# Patient Record
Sex: Male | Born: 1940 | Race: Black or African American | Hispanic: No | Marital: Married | Smoking: Former smoker
Health system: Southern US, Community
[De-identification: ages and names within clinical notes are randomized; demographics above are authoritative.]

## PROBLEM LIST (undated history)

## (undated) DIAGNOSIS — S72321A Displaced transverse fracture of shaft of right femur, initial encounter for closed fracture: Secondary | ICD-10-CM

## (undated) DIAGNOSIS — R739 Hyperglycemia, unspecified: Secondary | ICD-10-CM

## (undated) DIAGNOSIS — I10 Essential (primary) hypertension: Secondary | ICD-10-CM

## (undated) DIAGNOSIS — E119 Type 2 diabetes mellitus without complications: Secondary | ICD-10-CM

## (undated) DIAGNOSIS — E785 Hyperlipidemia, unspecified: Secondary | ICD-10-CM

## (undated) DIAGNOSIS — E669 Obesity, unspecified: Secondary | ICD-10-CM

## (undated) HISTORY — DX: Essential (primary) hypertension: I10

## (undated) HISTORY — PX: APPENDECTOMY: SHX54

## (undated) HISTORY — DX: Hyperglycemia, unspecified: R73.9

## (undated) HISTORY — PX: INGUINAL HERNIA REPAIR: SHX194

## (undated) HISTORY — DX: Displaced transverse fracture of shaft of right femur, initial encounter for closed fracture: S72.321A

## (undated) HISTORY — DX: Obesity, unspecified: E66.9

## (undated) HISTORY — DX: Hyperlipidemia, unspecified: E78.5

## (undated) HISTORY — DX: Type 2 diabetes mellitus without complications: E11.9

## (undated) HISTORY — PX: HIP SURGERY: SHX245

---

## 1971-08-14 HISTORY — PX: HEMORRHOID SURGERY: SHX153

## 2000-05-08 ENCOUNTER — Encounter: Payer: Self-pay | Admitting: Surgery

## 2000-05-08 ENCOUNTER — Ambulatory Visit (HOSPITAL_COMMUNITY): Admission: RE | Admit: 2000-05-08 | Discharge: 2000-05-08 | Payer: Self-pay | Admitting: Surgery

## 2000-05-08 ENCOUNTER — Encounter (INDEPENDENT_AMBULATORY_CARE_PROVIDER_SITE_OTHER): Payer: Self-pay | Admitting: Specialist

## 2007-02-26 ENCOUNTER — Ambulatory Visit: Payer: Self-pay | Admitting: Gastroenterology

## 2009-05-16 DIAGNOSIS — E785 Hyperlipidemia, unspecified: Secondary | ICD-10-CM | POA: Insufficient documentation

## 2009-05-16 DIAGNOSIS — E1139 Type 2 diabetes mellitus with other diabetic ophthalmic complication: Secondary | ICD-10-CM

## 2009-05-17 ENCOUNTER — Ambulatory Visit: Payer: Self-pay | Admitting: Gastroenterology

## 2009-05-17 DIAGNOSIS — G4730 Sleep apnea, unspecified: Secondary | ICD-10-CM | POA: Insufficient documentation

## 2009-10-26 ENCOUNTER — Encounter (INDEPENDENT_AMBULATORY_CARE_PROVIDER_SITE_OTHER): Payer: Self-pay | Admitting: *Deleted

## 2010-08-02 ENCOUNTER — Emergency Department (HOSPITAL_COMMUNITY)
Admission: EM | Admit: 2010-08-02 | Discharge: 2010-08-02 | Payer: Self-pay | Source: Home / Self Care | Admitting: Emergency Medicine

## 2010-09-12 NOTE — Letter (Signed)
Summary: Referral - not able to see patient  [facility]  [address] [place], [place] [address]   Phone: [phone]  Fax: [phone]    October 26, 2009         Dr. Elvina Sidle [address] [place], [place]  [address]             Re:   Matvey Bowne DOB:  [birth year] MRN:   [id number]    Dear Dr. Milus Glazier:  Thank you for your kind referral of the above patient.  We have attempted to schedule the recommended procedure, colonoscopy but have not been able to schedule because:  _x_ The patient was not available by phone and/or has not returned our calls.     (Pt has been scheduled for a colonoscopy twice, 2008 and 2010, and pt      canceled both.)  ___ The patient declined to schedule the procedure at this time.  We appreciate the referral and hope that we will have the opportunity to treat this patient in the future.    Sincerely,    [facility] [phone]

## 2010-12-29 NOTE — Op Note (Signed)
[place]  Patient:    Jerry Zimmerman, Jerry Zimmerman                        MRN: [id number] Proc. Date: 05/08/00 Adm. Date:  [id number] Attending:  Andre Lefort CC:         [facility]   Operative Report  PREOPERATIVE DIAGNOSIS:  Right inguinal hernia.  POSTOPERATIVE DIAGNOSIS:  Indirect right inguinal hernia.  OPERATION PERFORMED:  Right inguinal herniorrhaphy with mesh repair.  SURGEON:  Sandria Bales. Ezzard Standing, M.D.  ASSISTANT:  None.  ANESTHESIA:  MAC converted to general anesthesia with approximately 30 cc of local anesthetic.  COMPLICATIONS:  None.  INDICATIONS FOR PROCEDURE:  The patient is a [place] male who comes with a symptomatic right inguinal hernia and comes for repair of this hernia.  DESCRIPTION OF PROCEDURE:  The patient was placed in supine position.  Started off with a MAC anesthesia but this really did not feel very well.  I converted to a general anesthesia. He was given 1 gm of Ancef at the initiation of the procedure.  His right groin had been shaved, prepped with Betadine solution and sterilely draped.  I used local anesthetic and 1% Xylocaine with epinephrine with 0.25% Marcaine with epinephrine mixed 10:1 with sodium bicarbionate.  The skin incision was made down to the external oblique fascia which was opened.  The cord structures were encircled with a Penrose drain. the patient was noted to have a fairly large indirect inguinal hernia on the anterior medial surface of the cord structures.  This sac was elevated from the cord structures, taken down to the internal ring.  The sac was about 8 to 9 cm, had a neck of about 2 cm.  The sac was opened, a finger introduced into the peritoneal cavity.  There was no mass or other defect within the right lower quadrant.  The sac was then twisted and ligated with a 0 chromic suture.  I then carried out the inguinal floor.  Using a piece of Atrium mesh, the mesh was sewn in place  with 0 Novofil suture.  It was sewn medially to the pubic bone, inferiorly to the Coopers ligament, inferolaterally to the shelving edge of the inguinal ligament and superiorly to the transversalis fascia.  A keyhole was cut for the internal ring and the mesh was then sewn behind the keyhole.  The cord structures were returned to their normal location.  The ilioinguinal nerve was identified and spared during the dissection.  The external oblique fascia was then closed with interrupted 3-0 Vicryl suture.  The subcutaneous tissue closed with 3-0 Vicryl suture, the skin closed with running 5-0 subcuticular Vicryl suture, painted with tincture of benzoin, steri-stripped and sterilely dressed.  The patient tolerated the procedure well and was transported to the recovery room in good condition.  I used about 30 cc of local anesthetic during the procedure. He will be discharged home today to return to see me in about two weeks for follow-up. DD:  05/08/00 TD:  05/08/00 Job: [id number]

## 2011-08-26 ENCOUNTER — Encounter: Payer: Self-pay | Admitting: Physician Assistant

## 2011-08-26 DIAGNOSIS — R739 Hyperglycemia, unspecified: Secondary | ICD-10-CM

## 2011-08-26 DIAGNOSIS — I10 Essential (primary) hypertension: Secondary | ICD-10-CM | POA: Insufficient documentation

## 2011-08-26 DIAGNOSIS — E669 Obesity, unspecified: Secondary | ICD-10-CM | POA: Insufficient documentation

## 2011-09-19 ENCOUNTER — Encounter: Payer: Self-pay | Admitting: Gastroenterology

## 2011-10-09 ENCOUNTER — Ambulatory Visit (INDEPENDENT_AMBULATORY_CARE_PROVIDER_SITE_OTHER): Payer: Managed Care, Other (non HMO) | Admitting: Emergency Medicine

## 2011-10-09 ENCOUNTER — Encounter: Payer: Self-pay | Admitting: Physician Assistant

## 2011-10-09 ENCOUNTER — Ambulatory Visit: Payer: Managed Care, Other (non HMO)

## 2011-10-09 DIAGNOSIS — E782 Mixed hyperlipidemia: Secondary | ICD-10-CM

## 2011-10-09 DIAGNOSIS — E119 Type 2 diabetes mellitus without complications: Secondary | ICD-10-CM

## 2011-10-09 DIAGNOSIS — I10 Essential (primary) hypertension: Secondary | ICD-10-CM

## 2011-10-09 DIAGNOSIS — R739 Hyperglycemia, unspecified: Secondary | ICD-10-CM

## 2011-10-09 DIAGNOSIS — Z79899 Other long term (current) drug therapy: Secondary | ICD-10-CM

## 2011-10-09 DIAGNOSIS — E669 Obesity, unspecified: Secondary | ICD-10-CM

## 2011-10-09 DIAGNOSIS — E785 Hyperlipidemia, unspecified: Secondary | ICD-10-CM

## 2011-10-09 DIAGNOSIS — R7309 Other abnormal glucose: Secondary | ICD-10-CM

## 2011-10-09 LAB — TSH: TSH: 0.895 u[IU]/mL (ref 0.350–4.500)

## 2011-10-09 LAB — COMPREHENSIVE METABOLIC PANEL
ALT: 14 U/L (ref 0–53)
AST: 21 U/L (ref 0–37)
Albumin: 4.1 g/dL (ref 3.5–5.2)
Alkaline Phosphatase: 66 U/L (ref 39–117)
BUN: 15 mg/dL (ref 6–23)
CO2: 21 mEq/L (ref 19–32)
Calcium: 9.2 mg/dL (ref 8.4–10.5)
Chloride: 103 mEq/L (ref 96–112)
Creat: 0.88 mg/dL (ref 0.50–1.35)
Glucose, Bld: 118 mg/dL — ABNORMAL HIGH (ref 70–99)
Potassium: 4 mEq/L (ref 3.5–5.3)
Sodium: 136 mEq/L (ref 135–145)
Total Bilirubin: 0.7 mg/dL (ref 0.3–1.2)
Total Protein: 7.5 g/dL (ref 6.0–8.3)

## 2011-10-09 LAB — CBC WITH DIFFERENTIAL/PLATELET
Basophils Absolute: 0 10*3/uL (ref 0.0–0.1)
Basophils Relative: 1 % (ref 0–1)
Eosinophils Absolute: 0.3 10*3/uL (ref 0.0–0.7)
Eosinophils Relative: 5 % (ref 0–5)
HCT: 42 % (ref 39.0–52.0)
Hemoglobin: 14.5 g/dL (ref 13.0–17.0)
Lymphocytes Relative: 36 % (ref 12–46)
Lymphs Abs: 1.8 10*3/uL (ref 0.7–4.0)
MCH: 31.9 pg (ref 26.0–34.0)
MCHC: 34.5 g/dL (ref 30.0–36.0)
MCV: 92.5 fL (ref 78.0–100.0)
Monocytes Absolute: 0.6 10*3/uL (ref 0.1–1.0)
Monocytes Relative: 12 % (ref 3–12)
Neutro Abs: 2.4 10*3/uL (ref 1.7–7.7)
Neutrophils Relative %: 47 % (ref 43–77)
Platelets: 267 10*3/uL (ref 150–400)
RBC: 4.54 MIL/uL (ref 4.22–5.81)
RDW: 12.5 % (ref 11.5–15.5)
WBC: 5 10*3/uL (ref 4.0–10.5)

## 2011-10-09 LAB — POCT CBG (FASTING - GLUCOSE)-MANUAL ENTRY: Glucose Fasting, POC: 112 mg/dL — AB (ref 70–99)

## 2011-10-09 LAB — LIPID PANEL
Cholesterol: 286 mg/dL — ABNORMAL HIGH (ref 0–200)
HDL: 28 mg/dL — ABNORMAL LOW (ref >39)
Total CHOL/HDL Ratio: 10.2 Ratio
Triglycerides: 606 mg/dL — ABNORMAL HIGH (ref <150)

## 2011-10-09 LAB — POCT GLYCOSYLATED HEMOGLOBIN (HGB A1C): Hemoglobin A1C: 6.4

## 2011-10-09 NOTE — Assessment & Plan Note (Signed)
As long as A1C is below 6.5 he can continue working on lifestyle modification.  Above that, plan to restart metformin.  He tolerated it previously, but D/C'd it because he couldn't tell it was doing anything for him.

## 2011-10-09 NOTE — Assessment & Plan Note (Signed)
Restart gemfibrozil.  Discussed the importance of continuing this medication to reduce his risk of cardiovascular event.

## 2011-10-09 NOTE — Assessment & Plan Note (Signed)
Healthy eating, regular exercise.  Discussed the importance of healthy weight in reducing risk of cardiovascular events.

## 2011-10-09 NOTE — Progress Notes (Signed)
Subjective:    Patient ID: Jerry Zimmerman, male    DOB: [birth year], 71 y.o.   MRN: [id number]  HPI Patient presents for follow-up of HTN, hyperlipidemia and hyperglycemia. Stopped all medicine in December.  "Just to see."  "I want to check my kidneys, my heart and my lungs." "I don't feel any different." Long history of medication non-compliance. Former smoker.  Review of Systems Mild myalgias and arthralgias after long days at work. No chest pain, SOB, HA, dizziness, vision change, N/V, diarrhea, dysuria, or rash.     Objective:   Physical Exam  Constitutional: He is oriented to person, place, and time. Vital signs are normal. He appears well-developed and well-nourished. No distress.  HENT:  Head: Normocephalic and atraumatic.  Right Ear: Hearing normal.  Left Ear: Hearing normal.  Eyes: EOM are normal. Pupils are equal, round, and reactive to light.  Neck: Normal range of motion. Neck supple. No thyromegaly present.  Cardiovascular: Normal rate, regular rhythm and normal heart sounds.   Pulses:      Radial pulses are 2+ on the right side, and 2+ on the left side.       Dorsalis pedis pulses are 2+ on the right side, and 2+ on the left side.       Posterior tibial pulses are 2+ on the right side, and 2+ on the left side.  Pulmonary/Chest: Effort normal and breath sounds normal.  Lymphadenopathy:       Head (right side): No tonsillar, no preauricular, no posterior auricular and no occipital adenopathy present.       Head (left side): No tonsillar, no preauricular, no posterior auricular and no occipital adenopathy present.    He has no cervical adenopathy.       Right: No supraclavicular adenopathy present.       Left: No supraclavicular adenopathy present.  Neurological: He is alert and oriented to person, place, and time. No sensory deficit.  Skin: Skin is warm, dry and intact. No rash noted. No cyanosis or erythema. Nails show no clubbing.  Psychiatric: He has a normal mood and  affect.   Results for orders placed in visit on 10/09/11  COMPREHENSIVE METABOLIC PANEL      Component Value Range   Sodium 136  135 - 145 (mEq/L)   Potassium 4.0  3.5 - 5.3 (mEq/L)   Chloride 103  96 - 112 (mEq/L)   CO2 21  19 - 32 (mEq/L)   Glucose, Bld 118 (*) 70 - 99 (mg/dL)   BUN 15  6 - 23 (mg/dL)   Creat 4.09  8.11 - 9.14 (mg/dL)   Total Bilirubin 0.7  0.3 - 1.2 (mg/dL)   Alkaline Phosphatase 66  39 - 117 (U/L)   AST 21  0 - 37 (U/L)   ALT 14  0 - 53 (U/L)   Total Protein 7.5  6.0 - 8.3 (g/dL)   Albumin 4.1  3.5 - 5.2 (g/dL)   Calcium 9.2  8.4 - 78.2 (mg/dL)  LIPID PANEL      Component Value Range   Cholesterol 286 (*) 0 - 200 (mg/dL)   Triglycerides 956 (*) <150 (mg/dL)   HDL 28 (*) >21 (mg/dL)   Total CHOL/HDL Ratio 10.2     VLDL NOT CALC  0 - 40 (mg/dL)   LDL Cholesterol      POCT GLYCOSYLATED HEMOGLOBIN (HGB A1C)      Component Value Range   Hemoglobin A1C 6.4    POCT CBG (FASTING - GLUCOSE)-MANUAL  ENTRY      Component Value Range   Glucose Fasting, POC 112 (*) 70 - 99 (mg/dL)  CBC WITH DIFFERENTIAL      Component Value Range   WBC 5.0  4.0 - 10.5 (K/uL)   RBC 4.54  4.22 - 5.81 (MIL/uL)   Hemoglobin 14.5  13.0 - 17.0 (g/dL)   HCT 47.8  29.5 - 62.1 (%)   MCV 92.5  78.0 - 100.0 (fL)   MCH 31.9  26.0 - 34.0 (pg)   MCHC 34.5  30.0 - 36.0 (g/dL)   RDW 30.8  65.7 - 84.6 (%)   Platelets 267  150 - 400 (K/uL)   Neutrophils Relative 47  43 - 77 (%)   Neutro Abs 2.4  1.7 - 7.7 (K/uL)   Lymphocytes Relative 36  12 - 46 (%)   Lymphs Abs 1.8  0.7 - 4.0 (K/uL)   Monocytes Relative 12  3 - 12 (%)   Monocytes Absolute 0.6  0.1 - 1.0 (K/uL)   Eosinophils Relative 5  0 - 5 (%)   Eosinophils Absolute 0.3  0.0 - 0.7 (K/uL)   Basophils Relative 1  0 - 1 (%)   Basophils Absolute 0.0  0.0 - 0.1 (K/uL)   Smear Review Criteria for review not met    TSH      Component Value Range   TSH 0.895  0.350 - 4.500 (uIU/mL)      CXR: [facility] reading (PRIMARY) by  Dr. Cleta Alberts.  Tortuous aorta.  No infiltrates.      Assessment & Plan:

## 2011-10-09 NOTE — Patient Instructions (Addendum)
You need to restart your medicines.

## 2011-10-09 NOTE — Assessment & Plan Note (Signed)
Restart lisinopril.  Discussed the importance of BP control in reducing his risk of cardiovascular event.

## 2011-10-10 ENCOUNTER — Encounter: Payer: Self-pay | Admitting: Physician Assistant

## 2011-10-12 ENCOUNTER — Encounter: Payer: Self-pay | Admitting: Physician Assistant

## 2011-11-08 ENCOUNTER — Ambulatory Visit (INDEPENDENT_AMBULATORY_CARE_PROVIDER_SITE_OTHER): Payer: Managed Care, Other (non HMO) | Admitting: Physician Assistant

## 2011-11-08 ENCOUNTER — Encounter: Payer: Self-pay | Admitting: Physician Assistant

## 2011-11-08 VITALS — BP 126/76 | HR 89 | Temp 98.0°F | Resp 16 | Ht 66.0 in | Wt 187.0 lb

## 2011-11-08 DIAGNOSIS — I10 Essential (primary) hypertension: Secondary | ICD-10-CM

## 2011-11-08 DIAGNOSIS — E785 Hyperlipidemia, unspecified: Secondary | ICD-10-CM

## 2011-11-08 DIAGNOSIS — E119 Type 2 diabetes mellitus without complications: Secondary | ICD-10-CM

## 2011-11-08 DIAGNOSIS — E782 Mixed hyperlipidemia: Secondary | ICD-10-CM

## 2011-11-08 DIAGNOSIS — Z79899 Other long term (current) drug therapy: Secondary | ICD-10-CM

## 2011-11-08 LAB — BASIC METABOLIC PANEL
BUN: 14 mg/dL (ref 6–23)
Potassium: 4 mEq/L (ref 3.5–5.3)
Sodium: 135 mEq/L (ref 135–145)

## 2011-11-08 NOTE — Patient Instructions (Signed)
Continue taking the lisinopril for your blood pressure (it's WORKING!!!). Continue taking the gemfibrozil for your cholesterol. Add an aspirin daily (81 mg) to help prevent a heart attack and stroke. When you return in 2 months, do not eat or drink anything for 8-12 hours, so we can check your cholesterol (you may take your medication and drink water, black coffee, or unsweetened tea).

## 2011-11-08 NOTE — Assessment & Plan Note (Signed)
Continue current treatment, pending lab results. Update lipids in 2 months.

## 2011-11-08 NOTE — Assessment & Plan Note (Addendum)
Well controlled. Continue current treatment, pending lab results. Restart ASA 81 mg QD

## 2011-11-08 NOTE — Progress Notes (Signed)
  Subjective:    Patient ID: Jerry Zimmerman, male    DOB: April 24, 1941, 71 y.o.   MRN: [id number]  HPI  Presents for follow-up of HTN after restarting lisinopril 5 mg.  Has also restarted gemfibrozil for mixed hyperlipidemia, with significant hypertriglyceridemia.  He reports he feels no different than before.  He has stopped the daily aspirin, not sure why.  Review of Systems No chest pain, SOB, HA, dizziness, vision change, N/V, diarrhea, dysuria, myalgias, arthralgias or rash.     Objective:   Physical Exam Vital signs noted. Well-developed, well nourished [name] who is awake, alert and oriented, in NAD. HEENT: [place]/AT, sclera and conjunctiva are clear.   Neck: supple, non-tender, no lymphadenopathy, thyromegaly. Heart: RRR, no murmur Lungs: CTA Extremities: no cyanosis, clubbing or edema. Skin: warm and dry without rash.  BMET is pending.      Assessment & Plan:   1. Unspecified essential hypertension  Basic metabolic panel  2. Other and unspecified hyperlipidemia    3. Type II or unspecified type diabetes mellitus without mention of complication, not stated as uncontrolled    4. Encounter for long-term (current) use of other medications  Basic metabolic panel

## 2011-11-09 ENCOUNTER — Encounter: Payer: Self-pay | Admitting: Physician Assistant

## 2012-01-01 ENCOUNTER — Ambulatory Visit (INDEPENDENT_AMBULATORY_CARE_PROVIDER_SITE_OTHER): Payer: BC Managed Care – PPO | Admitting: Physician Assistant

## 2012-01-01 ENCOUNTER — Encounter: Payer: Self-pay | Admitting: Physician Assistant

## 2012-01-01 VITALS — BP 131/84 | HR 89 | Temp 98.1°F | Resp 16 | Ht 66.5 in | Wt 190.2 lb

## 2012-01-01 DIAGNOSIS — E785 Hyperlipidemia, unspecified: Secondary | ICD-10-CM

## 2012-01-01 DIAGNOSIS — E119 Type 2 diabetes mellitus without complications: Secondary | ICD-10-CM

## 2012-01-01 DIAGNOSIS — E781 Pure hyperglyceridemia: Secondary | ICD-10-CM

## 2012-01-01 DIAGNOSIS — I10 Essential (primary) hypertension: Secondary | ICD-10-CM

## 2012-01-01 LAB — COMPREHENSIVE METABOLIC PANEL
ALT: 12 U/L (ref 0–53)
AST: 16 U/L (ref 0–37)
Calcium: 8.9 mg/dL (ref 8.4–10.5)
Chloride: 101 mEq/L (ref 96–112)
Creat: 0.86 mg/dL (ref 0.50–1.35)
Potassium: 4 mEq/L (ref 3.5–5.3)

## 2012-01-01 LAB — POCT GLYCOSYLATED HEMOGLOBIN (HGB A1C): Hemoglobin A1C: 6.9

## 2012-01-01 LAB — LIPID PANEL: Total CHOL/HDL Ratio: 12.8 Ratio

## 2012-01-01 LAB — GLUCOSE, POCT (MANUAL RESULT ENTRY): POC Glucose: 161 mg/dl — AB (ref 70–99)

## 2012-01-01 NOTE — Patient Instructions (Addendum)
Continue your medications.  If your cholesterol is still too high, I'll ask that you increase the gemfibrozil (Lopid) to twice daily.  Continue working on healthy eating and regular exercise to help protect your heart, keep your cholesterol down, and prevent your blood sugar from getting too high.

## 2012-01-01 NOTE — Progress Notes (Signed)
  Subjective:    Patient ID: Jerry Zimmerman, male    DOB: March 30, 1941, 71 y.o.   MRN: [id number]  HPI Presents for follow-up of HTN, hyperlipidemia, and DM type 2.  He feels well. Joint stiffness when gets up from sleep. Resolves with movement and stretching, and he doesn't want to take any medication for it.  Only takes [place] once daily. States he can't tell any difference on the medications and again wants to D/C them.  Review of Systems No chest pain, SOB, HA, dizziness, vision change, N/V, diarrhea, dysuria, myalgias or rash.  Joint stiff ness as above.     Objective:   Physical Exam  Vital signs noted. Well-developed, well nourished [place] who is awake, alert and oriented, in NAD. HEENT: [place]/AT, PERRL, EOMI.  Sclera and conjunctiva are clear.  Neck: supple, non-tender, no lymphadenopathy, thyromegaly. Heart: RRR, no murmur Lungs: CTA Abdomen: normo-active bowel sounds, supple, non-tender, no mass or organomegaly. Extremities: no cyanosis, clubbing or edema. Skin: warm and dry without rash.  Results for orders placed in visit on 01/01/12  GLUCOSE, POCT (MANUAL RESULT ENTRY)      Component Value Range   POC Glucose 161 (*) 70 - 99 (mg/dl)  POCT GLYCOSYLATED HEMOGLOBIN (HGB A1C)      Component Value Range   Hemoglobin A1C 6.9          Assessment & Plan:

## 2012-01-01 NOTE — Assessment & Plan Note (Signed)
A1C is 6.9, and our plan had been to restart metformin if it was above 6.5.  However, he's still struggling taking his other medications regularly (because he doesn't really think he needs them), so I've elected to hold off for now. He's encouraged to continue working on lifestyle changes.

## 2012-01-01 NOTE — Assessment & Plan Note (Signed)
Non-compliance continues to be a problem here.  He's only taking gemfibrozil once daily and is reluctant to increase it to BID. We agree to wait to see what his labs show today before increasing the dose to BID.

## 2012-01-01 NOTE — Assessment & Plan Note (Signed)
Continue current treatment.  Reminded that even though his pressure is improved, he needs to stay on the lisinopril to keep it down.

## 2012-01-03 ENCOUNTER — Encounter: Payer: Self-pay | Admitting: Physician Assistant

## 2012-03-09 ENCOUNTER — Other Ambulatory Visit: Payer: Self-pay | Admitting: Physician Assistant

## 2012-03-12 ENCOUNTER — Other Ambulatory Visit: Payer: Self-pay

## 2012-03-27 ENCOUNTER — Ambulatory Visit: Payer: BC Managed Care – PPO | Admitting: Physician Assistant

## 2012-04-08 ENCOUNTER — Ambulatory Visit (INDEPENDENT_AMBULATORY_CARE_PROVIDER_SITE_OTHER): Payer: BC Managed Care – PPO | Admitting: Physician Assistant

## 2012-04-08 ENCOUNTER — Encounter: Payer: Self-pay | Admitting: Physician Assistant

## 2012-04-08 VITALS — BP 134/78 | HR 81 | Temp 98.1°F | Resp 16 | Ht 66.0 in | Wt 187.0 lb

## 2012-04-08 DIAGNOSIS — E119 Type 2 diabetes mellitus without complications: Secondary | ICD-10-CM

## 2012-04-08 DIAGNOSIS — E785 Hyperlipidemia, unspecified: Secondary | ICD-10-CM

## 2012-04-08 DIAGNOSIS — I10 Essential (primary) hypertension: Secondary | ICD-10-CM

## 2012-04-08 DIAGNOSIS — E782 Mixed hyperlipidemia: Secondary | ICD-10-CM

## 2012-04-08 DIAGNOSIS — Z23 Encounter for immunization: Secondary | ICD-10-CM

## 2012-04-08 LAB — COMPREHENSIVE METABOLIC PANEL
AST: 21 U/L (ref 0–37)
Albumin: 3.8 g/dL (ref 3.5–5.2)
BUN: 14 mg/dL (ref 6–23)
Calcium: 8.9 mg/dL (ref 8.4–10.5)
Chloride: 105 mEq/L (ref 96–112)
Glucose, Bld: 119 mg/dL — ABNORMAL HIGH (ref 70–99)
Potassium: 3.8 mEq/L (ref 3.5–5.3)

## 2012-04-08 LAB — LIPID PANEL: HDL: 28 mg/dL — ABNORMAL LOW (ref >39)

## 2012-04-08 MED ORDER — DOXYCYCLINE HYCLATE 100 MG PO TABS: 100.0000 mg | ORAL_TABLET | Freq: Every day | ORAL | Status: AC

## 2012-04-08 MED ORDER — GEMFIBROZIL 600 MG PO TABS
600.0000 mg | ORAL_TABLET | Freq: Two times a day (BID) | ORAL | Status: DC
Start: 2012-04-08 — End: 2013-01-01

## 2012-04-08 MED ORDER — LISINOPRIL 5 MG PO TABS: 5.0000 mg | ORAL_TABLET | Freq: Every day | ORAL | Status: DC

## 2012-04-08 NOTE — Assessment & Plan Note (Signed)
Encouraged him to take gemfibrozil BID, even if he breaks them in half.

## 2012-04-08 NOTE — Assessment & Plan Note (Signed)
Controlled.  Continue current treatment, and DON"T RUN OUT!

## 2012-04-08 NOTE — Progress Notes (Signed)
  Subjective:    Patient ID: Jerry Zimmerman, male    DOB: Jan 22, 1941, 71 y.o.   MRN: [id number]  HPI This 71 y.o. male presents for evaluation of HTN, hyperlipidemia. Has only been taking the gemfibrozil QD, "because the pills are too big." Will be leaving for [place] next month, will be gone for 2 months.  Will need malaria prophylaxis and larger supply of his regular medications. Review of Systems  Denies chest pain, shortness of breath, HA, dizziness, vision change, nausea, vomiting, diarrhea, constipation, melena, hematochezia, dysuria, increased urinary urgency or frequency, increased hunger or thirst, unintentional weight change, unexplained myalgias or arthralgias, rash.   Past Medical History  Diagnosis Date  . Hyperlipidemia   . Hyperglycemia   . Essential hypertension, benign   . Obesity     Past Surgical History  Procedure Date  . Inguinal hernia repair     right  . Hernia repair     Prior to Admission medications   Medication Sig Start Date End Date Taking? Authorizing Provider  aspirin 81 MG tablet Take 81 mg by mouth daily.   Yes Historical Provider, MD  gemfibrozil (LOPID) 600 MG tablet Take 600 mg by mouth 2 (two) times daily before a meal.   Yes Historical Provider, MD  lisinopril (PRINIVIL,ZESTRIL) 5 MG tablet TAKE ONE TABLET BY MOUTH EVERY DAY 03/09/12  Yes Morrell Riddle, PA-C    No Known Allergies  History   Social History  . Marital Status: Married    Spouse Name: N/A    Number of Children: 4  . Years of Education: 14   Occupational History  . Quality Programmer, multimedia (pill maufacturing)   Social History Main Topics  . Smoking status: Former Smoker -- 3.0 packs/day for 18 years    Types: Cigarettes    Quit date: 08/13/1987  . Smokeless tobacco: Not on file  . Alcohol Use: No  . Drug Use: No  . Sexually Active: Yes -- Male partner(s)   Other Topics Concern  . 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    History reviewed. No pertinent family history.     Objective:   Physical Exam  Blood pressure 134/78, pulse 81, temperature 98.1 F (36.7 C), temperature source Oral, resp. rate 16, height 5\' 6"  (1.676 m), weight 187 lb (84.823 kg), SpO2 98.00%. Body mass index is 30.18 kg/(m^2). Well-developed, well nourished [name] who is awake, alert and oriented, in NAD. HEENT: [place]/AT, PERRL, EOMI.  Sclera and conjunctiva are clear.  EAC are patent, TMs are normal in appearance. Nasal mucosa is pink and moist. OP is clear. Neck: supple, non-tender, no lymphadenopathy, thyromegaly. Heart: RRR, no murmur Lungs: CTA Abdomen: normo-active bowel sounds, supple, non-tender, no mass or organomegaly. Extremities: no cyanosis, clubbing or edema. Skin: warm and dry without rash.     Assessment & Plan:

## 2012-04-08 NOTE — Assessment & Plan Note (Signed)
Continue lifestyle modification.  Still trouble with compliance.  Recheck 07/2012.

## 2012-04-09 ENCOUNTER — Encounter: Payer: Self-pay | Admitting: Physician Assistant

## 2012-07-31 ENCOUNTER — Ambulatory Visit (INDEPENDENT_AMBULATORY_CARE_PROVIDER_SITE_OTHER): Payer: BC Managed Care – PPO | Admitting: Physician Assistant

## 2012-07-31 ENCOUNTER — Encounter: Payer: Self-pay | Admitting: Physician Assistant

## 2012-07-31 VITALS — BP 146/70 | HR 92 | Temp 98.0°F | Resp 16 | Ht 65.75 in | Wt 188.4 lb

## 2012-07-31 DIAGNOSIS — E785 Hyperlipidemia, unspecified: Secondary | ICD-10-CM

## 2012-07-31 DIAGNOSIS — K921 Melena: Secondary | ICD-10-CM

## 2012-07-31 DIAGNOSIS — I10 Essential (primary) hypertension: Secondary | ICD-10-CM

## 2012-07-31 DIAGNOSIS — E119 Type 2 diabetes mellitus without complications: Secondary | ICD-10-CM

## 2012-07-31 LAB — COMPREHENSIVE METABOLIC PANEL
ALT: 15 U/L (ref 0–53)
AST: 20 U/L (ref 0–37)
Albumin: 3.8 g/dL (ref 3.5–5.2)
BUN: 11 mg/dL (ref 6–23)
Calcium: 9.2 mg/dL (ref 8.4–10.5)
Chloride: 103 mEq/L (ref 96–112)
Potassium: 4 mEq/L (ref 3.5–5.3)
Sodium: 136 mEq/L (ref 135–145)
Total Protein: 7.5 g/dL (ref 6.0–8.3)

## 2012-07-31 LAB — GLUCOSE, POCT (MANUAL RESULT ENTRY): POC Glucose: 123 mg/dl — AB (ref 70–99)

## 2012-07-31 LAB — TSH: TSH: 0.58 u[IU]/mL (ref 0.350–4.500)

## 2012-07-31 MED ORDER — METFORMIN HCL ER (MOD) 500 MG PO TB24: 500.0000 mg | ORAL_TABLET | Freq: Every day | ORAL | Status: DC

## 2012-07-31 NOTE — Patient Instructions (Addendum)
Make sure you drink plenty of fluids and eat foods containing potassium to help prevent cramping in your legs during prayers and at night. Your blood sugar is elevated, so I would like you to restart METFORMIN to lower it.  Take it ONE TIME EVERY DAY, along with your other medications as well.

## 2012-07-31 NOTE — Progress Notes (Signed)
Subjective:    Patient ID: Jerry Zimmerman, male    DOB: Jul 31, 1941, 71 y.o.   MRN: [id number]  HPI This 71 y.o. male presents for evaluation of HTN, elevated glucose and hyperlipidemia.  He says he feels great, but again he's not taking lisinopril regularly (only sometimes).  He states that he doesn't feel any different whether he takes it or not.  He is taking the gemfibrozil BID, however, at least presently.  He reports some bright red blood on the toilet tissue after BM intermittently.  He believes it is related to surgery he had in the 1970's for "piles," and denies pain with BM, melena. He's going to visit [place] in February, and wants to get this evaluated before he leaves.  He has not had a colonoscopy in the past 10 years.  Past Medical History  Diagnosis Date  . Hyperlipidemia   . Hyperglycemia   . Essential hypertension, benign   . Obesity     Past Surgical History  Procedure Date  . Inguinal hernia repair     right  . Hernia repair   . Hemorrhoid surgery 1973    Prior to Admission medications   Medication Sig Start Date End Date Taking? Authorizing Provider  aspirin 81 MG tablet Take 81 mg by mouth daily.   Yes Historical Provider, MD  gemfibrozil (LOPID) 600 MG tablet Take 1 tablet (600 mg total) by mouth 2 (two) times daily before a meal. 04/08/12  Yes Shekera Beavers S Lucilia Yanni, PA-C  lisinopril (PRINIVIL,ZESTRIL) 5 MG tablet Take 1 tablet (5 mg total) by mouth daily. 04/08/12   Fernande Bras, PA-C    Not on File  History   Social History  . Marital Status: Married    Spouse Name: N/A    Number of Children: 4  . Years of Education: 14   Occupational History  . Quality Programmer, multimedia (pill maufacturing)   Social History Main Topics  . Smoking status: Former Smoker -- 3.0 packs/day for 18 years    Types: Cigarettes    Quit date: 08/13/1987  . Smokeless tobacco: Not on file  . Alcohol Use: No  . Drug Use: No  . Sexually Active: Yes -- Male partner(s)    Other Topics Concern  . 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    History reviewed. No pertinent family history.  Review of Systems As above. Denies chest pain, shortness of breath, HA, dizziness, vision change, nausea, vomiting, diarrhea, constipation, dysuria, increased urinary urgency or frequency, increased hunger or thirst, unintentional weight change, unexplained myalgias or arthralgias, rash.     Objective:   Physical Exam Blood pressure 146/70, pulse 92, temperature 98 F (36.7 C), temperature source Oral, resp. rate 16, height 5' 5.75" (1.67 m), weight 188 lb 6.4 oz (85.458 kg), SpO2 99.00%. Body mass index is 30.64 kg/(m^2). Well-developed, well nourished [name] who is awake, alert and oriented, in NAD. HEENT: [place]/AT, sclera and conjunctiva are clear.   Neck: supple, non-tender, no lymphadenopathy, thyromegaly. Heart: RRR, no murmur Lungs: normal effort, CTA Extremities: no cyanosis, clubbing or edema. Skin: warm and dry without rash. Psychologic: good mood and appropriate affect, normal speech and behavior.   Results for orders placed in visit on 07/31/12  GLUCOSE, POCT (MANUAL RESULT ENTRY)  Component Value Range   POC Glucose 123 (*) 70 - 99 mg/dl  POCT GLYCOSYLATED HEMOGLOBIN (HGB A1C)      Component Value Range   Hemoglobin A1C 7.3         Assessment & Plan:   1. Essential hypertension, benign -uncontrolled, likely due to medication non-compliance TSH; stressed the importance of taking his prescribed medications every day, even if he doesn't feel any difference.  2. DIABETES MELLITUS  POCT glucose (manual entry), POCT glycosylated hemoglobin (Hb A1C), RESTART metFORMIN (GLUMETZA) 500 MG (MOD) 24 hr tablet  3. HYPERLIPIDEMIA   Comprehensive metabolic panel, Lipid panel; continue gemfibrozil BID  4. Hematochezia  Ambulatory referral to Gastroenterology, needs colonoscopy   He will [place] in February before his trip.  We will again discuss outstanding health maintenance items, including DRE, PSA, shingles vaccination, etc.

## 2012-08-01 ENCOUNTER — Encounter: Payer: Self-pay | Admitting: Physician Assistant

## 2012-08-04 ENCOUNTER — Encounter: Payer: Self-pay | Admitting: Gastroenterology

## 2012-08-21 ENCOUNTER — Encounter: Payer: Self-pay | Admitting: *Deleted

## 2012-08-28 ENCOUNTER — Ambulatory Visit: Payer: Self-pay | Admitting: Gastroenterology

## 2012-08-28 ENCOUNTER — Telehealth: Payer: Self-pay | Admitting: Gastroenterology

## 2012-08-28 ENCOUNTER — Telehealth: Payer: Self-pay | Admitting: Physician Assistant

## 2012-08-28 NOTE — Telephone Encounter (Signed)
Dismissal Letter sent by Certified Mail 08/28/2012  Received the Return Receipt showing someone picked up the Dismissal Letter 09/01/2012

## 2012-08-28 NOTE — Telephone Encounter (Signed)
Patient said he didn't know he had an appt. But said he would call there office today and reschedule.

## 2012-08-28 NOTE — Telephone Encounter (Signed)
Please call the patient.  I received a call that he did not show up for his appointment with Dr. Jarold Motto, GI specialist.  What happened?

## 2012-08-28 NOTE — Telephone Encounter (Signed)
Pt called in wanting to make an appointment. I explained to him that he has been discharged from the practice and will need to continue his GI care with another GI Dr.  Also told him he will be getting a letter in the mail.  Pt said thank you and hung up

## 2012-09-23 LAB — HM COLONOSCOPY: HM Colonoscopy: NORMAL

## 2012-09-25 ENCOUNTER — Ambulatory Visit: Payer: BC Managed Care – PPO | Admitting: Physician Assistant

## 2012-10-09 ENCOUNTER — Encounter: Payer: Self-pay | Admitting: Physician Assistant

## 2012-10-09 ENCOUNTER — Ambulatory Visit (INDEPENDENT_AMBULATORY_CARE_PROVIDER_SITE_OTHER): Payer: BC Managed Care – PPO | Admitting: Physician Assistant

## 2012-10-09 VITALS — BP 133/76 | HR 56 | Temp 97.7°F | Resp 16 | Ht 66.0 in | Wt 190.0 lb

## 2012-10-09 DIAGNOSIS — E119 Type 2 diabetes mellitus without complications: Secondary | ICD-10-CM

## 2012-10-09 LAB — LIPID PANEL
Cholesterol: 255 mg/dL — ABNORMAL HIGH (ref 0–200)
LDL Cholesterol: 157 mg/dL — ABNORMAL HIGH (ref 0–99)
Total CHOL/HDL Ratio: 8.2 Ratio
Triglycerides: 337 mg/dL — ABNORMAL HIGH (ref <150)
VLDL: 67 mg/dL — ABNORMAL HIGH (ref 0–40)

## 2012-10-09 LAB — COMPREHENSIVE METABOLIC PANEL
ALT: 17 U/L (ref 0–53)
Alkaline Phosphatase: 58 U/L (ref 39–117)
CO2: 23 mEq/L (ref 19–32)
Creat: 0.89 mg/dL (ref 0.50–1.35)
Sodium: 136 mEq/L (ref 135–145)
Total Bilirubin: 0.7 mg/dL (ref 0.3–1.2)
Total Protein: 7.1 g/dL (ref 6.0–8.3)

## 2012-10-09 LAB — POCT GLYCOSYLATED HEMOGLOBIN (HGB A1C): Hemoglobin A1C: 6.9

## 2012-10-09 NOTE — Progress Notes (Signed)
Subjective:    Patient ID: Jerry Zimmerman, male    DOB: Jun 12, 1941, 72 y.o.   MRN: [id number]  HPI This 72 y.o. male presents for evaluation of HTN, hyperlipidemia, DM type 2. He reports that he feels great, except for general mild aches and pains associated with heavy work. Unfortunately, he has not been taking the metformin prescribed at his last visit, and thinks he's taking his other medications.  He has a long history of medication noncompliance.   Past Medical History  Diagnosis Date  . Hyperlipidemia   . Hyperglycemia   . Essential hypertension, benign   . Obesity     Past Surgical History  Procedure Laterality Date  . Inguinal hernia repair      right  . Appendectomy    . Hemorrhoid surgery  1973    Prior to Admission medications   Medication Sig Start Date End Date Taking? Authorizing Provider  aspirin 81 MG tablet Take 81 mg by mouth daily.   Yes Historical Provider, MD  gemfibrozil (LOPID) 600 MG tablet Take 1 tablet (600 mg total) by mouth 2 (two) times daily before a meal. 04/08/12  Yes Lily Kernen S Takisha Pelle, PA-C  lisinopril (PRINIVIL,ZESTRIL) 5 MG tablet Take 1 tablet (5 mg total) by mouth daily. 04/08/12  Yes Jolleen Seman S Chevonne Bostrom, PA-C  metFORMIN (GLUMETZA) 500 MG (MOD) 24 hr tablet Take 1 tablet (500 mg total) by mouth daily with breakfast. 07/31/12   Fernande Bras, PA-C    No Known Allergies  History   Social History  . Marital Status: Married    Spouse Name: N/A    Number of Children: 4  . Years of Education: 14   Occupational History  . Quality Programmer, multimedia (pill maufacturing)   Social History Main Topics  . Smoking status: Former Smoker -- 3.00 packs/day for 18 years    Types: Cigarettes    Quit date: 08/13/1987  . Smokeless tobacco: Never Used  . Alcohol Use: No  . Drug Use: No  . Sexually Active: Yes -- Male partner(s)   Other Topics Concern  . Not on file   Social History Narrative   Originally from [place], one of 12 siblings.   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             Family History  Problem Relation Age of Onset  . Colon cancer Neg Hx      Review of Systems No chest pain, SOB, HA, dizziness, vision change, N/V, diarrhea, constipation, dysuria, urinary urgency or frequency, myalgias, arthralgias or rash.     Objective:   Physical Exam Blood pressure 133/76, pulse 56, temperature 97.7 F (36.5 C), resp. rate 16, height 5\' 6"  (1.676 m), weight 190 lb (86.183 kg). Body mass index is 30.68 kg/(m^2). Well-developed, well nourished [name] who is awake, alert and oriented, in NAD. HEENT: [place]/AT, PERRL, EOMI.  Sclera and conjunctiva are clear.  EAC are patent, TMs are normal in appearance. Nasal mucosa is pink and moist. OP is clear. Neck: supple, non-tender, no lymphadenopathy, thyromegaly. Heart: RRR, no murmur Lungs: normal effort, CTA Abdomen: normo-active bowel sounds, supple, non-tender, no mass or organomegaly. Extremities: no cyanosis, clubbing or edema. Skin: warm and dry without rash. Psychologic: good mood and appropriate affect, normal speech and behavior.  See DM foot exam.  Results for orders placed in visit on 10/09/12  GLUCOSE, POCT (MANUAL RESULT  ENTRY)      Result Value Range   POC Glucose 109 (*) 70 - 99 mg/dl  POCT GLYCOSYLATED HEMOGLOBIN (HGB A1C)      Result Value Range   Hemoglobin A1C 6.9         Assessment & Plan:  Type II or unspecified type diabetes mellitus without mention of complication, not stated as uncontrolled - Plan: POCT glucose (manual entry), POCT glycosylated hemoglobin (Hb A1C), Comprehensive metabolic panel, Microalbumin, urine  HTN (hypertension)  Mixed hyperlipidemia - Plan: Lipid panel  Screening for colon cancer - Plan: PSA  Reminded him to take all his meds EVERY SINGLE DAY. RTC 3 months.

## 2012-10-09 NOTE — Patient Instructions (Signed)
Please take your medications as prescribed.  Check with the pharmacy about the metformin.  Please take it just ONCE each day (the instructions will say to take it twice each day, but since your blood sugar is improved, you only need to take it once each day).

## 2012-10-10 ENCOUNTER — Encounter: Payer: Self-pay | Admitting: Physician Assistant

## 2012-11-06 ENCOUNTER — Telehealth: Payer: Self-pay

## 2012-11-06 DIAGNOSIS — I10 Essential (primary) hypertension: Secondary | ICD-10-CM

## 2012-11-06 DIAGNOSIS — E1165 Type 2 diabetes mellitus with hyperglycemia: Secondary | ICD-10-CM

## 2012-11-06 MED ORDER — LISINOPRIL 5 MG PO TABS: 5.0000 mg | ORAL_TABLET | Freq: Every day | ORAL | Status: DC

## 2012-11-06 MED ORDER — METFORMIN HCL 500 MG PO TABS: 500.0000 mg | ORAL_TABLET | Freq: Every day | ORAL | Status: DC

## 2012-11-06 NOTE — Telephone Encounter (Signed)
ALSO NEEDS REFILL ON BLOOD PRESSURE MED

## 2012-11-06 NOTE — Telephone Encounter (Signed)
Thanks, I have advised patient of this.

## 2012-11-06 NOTE — Telephone Encounter (Signed)
Pended lisinopril please advise. I think Glumetza should have been Meftormin. Jerry Zimmerman

## 2012-11-06 NOTE — Telephone Encounter (Signed)
THE MED THAT [name] PRESCRIBED FOR DIABETES IS TOO EXPENSIVE. CAN SHE PRESCRIBE SOMETHING FROM THE [facility] LIST FOR 90 DAY SUPPLY?  THANKS  [facility] [address]

## 2012-11-06 NOTE — Telephone Encounter (Signed)
I have sent in meds.  I sent regular Metformin to the pharmacy for the patient.  He may have more GI complaints with the immediate release than with the extended release.

## 2013-01-01 ENCOUNTER — Encounter: Payer: Self-pay | Admitting: Physician Assistant

## 2013-01-01 ENCOUNTER — Ambulatory Visit (INDEPENDENT_AMBULATORY_CARE_PROVIDER_SITE_OTHER): Payer: BC Managed Care – PPO | Admitting: Physician Assistant

## 2013-01-01 VITALS — BP 120/76 | HR 80 | Temp 99.0°F | Resp 16 | Ht 66.5 in | Wt 184.0 lb

## 2013-01-01 DIAGNOSIS — E785 Hyperlipidemia, unspecified: Secondary | ICD-10-CM

## 2013-01-01 DIAGNOSIS — E119 Type 2 diabetes mellitus without complications: Secondary | ICD-10-CM

## 2013-01-01 DIAGNOSIS — I10 Essential (primary) hypertension: Secondary | ICD-10-CM

## 2013-01-01 DIAGNOSIS — E782 Mixed hyperlipidemia: Secondary | ICD-10-CM

## 2013-01-01 LAB — LIPID PANEL
Cholesterol: 187 mg/dL (ref 0–200)
HDL: 33 mg/dL — ABNORMAL LOW (ref >39)
Total CHOL/HDL Ratio: 5.7 Ratio
Triglycerides: 185 mg/dL — ABNORMAL HIGH (ref <150)
VLDL: 37 mg/dL (ref 0–40)

## 2013-01-01 LAB — COMPREHENSIVE METABOLIC PANEL
AST: 18 U/L (ref 0–37)
BUN: 13 mg/dL (ref 6–23)
Calcium: 8.9 mg/dL (ref 8.4–10.5)
Chloride: 104 mEq/L (ref 96–112)
Creat: 0.84 mg/dL (ref 0.50–1.35)
Total Bilirubin: 0.8 mg/dL (ref 0.3–1.2)

## 2013-01-01 MED ORDER — GEMFIBROZIL 600 MG PO TABS: 600.0000 mg | ORAL_TABLET | Freq: Two times a day (BID) | ORAL | Status: DC

## 2013-01-01 MED ORDER — LISINOPRIL 5 MG PO TABS: 5.0000 mg | ORAL_TABLET | Freq: Every day | ORAL | Status: DC

## 2013-01-01 MED ORDER — METFORMIN HCL 500 MG PO TABS: 500.0000 mg | ORAL_TABLET | Freq: Every day | ORAL | Status: DC

## 2013-01-01 NOTE — Patient Instructions (Signed)
Keep taking your medications-all of them.  Follow the instructions on the bottles. Please also take an aspirin every day, 81 mg.

## 2013-01-01 NOTE — Progress Notes (Signed)
  Subjective:    Patient ID: Jerry Zimmerman, male    DOB: 09/09/1940, 72 y.o.   MRN: [id number]  HPI This 72 y.o. male presents for evaluation of DM type 2, HTN, Hyperlipidemia.  Patient Active Problem List   Diagnosis Date Noted  . Essential hypertension, benign   . Obesity   . SLEEP APNEA 05/17/2009  . DIABETES MELLITUS 05/16/2009  . HYPERLIPIDEMIA 05/16/2009   Past medical history, surgical history, family history, social history and problem list reviewed.  Doesn't check glucose at home. Checks feet daily.  Current of flu and pneumococcal vaccine.  Review of Systems Some looser stools with regular use of metformin.  Tolerating gemfibrozil. Denies chest pain, shortness of breath, HA, dizziness, vision change, nausea, vomiting, melena, hematochezia, dysuria, increased urinary urgency or frequency, increased hunger or thirst, unintentional weight change, unexplained myalgias or arthralgias, rash.     Objective:   Physical Exam Blood pressure 120/76, pulse 80, temperature 99 F (37.2 C), temperature source Oral, resp. rate 16, height 5' 6.5" (1.689 m), weight 184 lb (83.462 kg), SpO2 99.00%. Body mass index is 29.26 kg/(m^2). Well-developed, well nourished [name] who is awake, alert and oriented, in NAD. HEENT: [place]/AT, PERRL, EOMI.  Sclera and conjunctiva are clear.  EAC are patent, TMs are normal in appearance. Nasal mucosa is pink and moist. OP is clear. Neck: supple, non-tender, no lymphadenopathy, thyromegaly. Heart: RRR, no murmur Lungs: normal effort, CTA Abdomen: normo-active bowel sounds, supple, non-tender, no mass or organomegaly. Extremities: no cyanosis, clubbing or edema. Skin: warm and dry without rash. See DM foot exam. Psychologic: good mood and appropriate affect, normal speech and behavior.  Results for orders placed in visit on 01/01/13  GLUCOSE, POCT (MANUAL RESULT ENTRY)      Result Value Range   POC Glucose 106 (*) 70 - 99 mg/dl  POCT GLYCOSYLATED  HEMOGLOBIN (HGB A1C)      Result Value Range   Hemoglobin A1C 6.3        Assessment & Plan:  DIABETES MELLITUS - Plan: POCT glucose (manual entry), POCT glycosylated hemoglobin (Hb A1C), Comprehensive metabolic panel, metFORMIN (GLUCOPHAGE) 500 MG tablet  Essential hypertension, benign - Plan: lisinopril (PRINIVIL,ZESTRIL) 5 MG tablet  HYPERLIPIDEMIA - Plan: Lipid panel, gemfibrozil (LOPID) 600 MG tablet  RTC 3 months.  Fernande Bras, PA-C Physician Assistant-Certified [facility]

## 2013-03-12 LAB — HM DIABETES EYE EXAM

## 2013-04-06 ENCOUNTER — Encounter: Payer: Self-pay | Admitting: Physician Assistant

## 2013-04-09 ENCOUNTER — Ambulatory Visit (INDEPENDENT_AMBULATORY_CARE_PROVIDER_SITE_OTHER): Payer: BC Managed Care – PPO | Admitting: Physician Assistant

## 2013-04-09 ENCOUNTER — Encounter: Payer: Self-pay | Admitting: Physician Assistant

## 2013-04-09 VITALS — BP 130/78 | HR 78 | Temp 98.0°F | Resp 16 | Ht 66.0 in | Wt 184.0 lb

## 2013-04-09 DIAGNOSIS — E785 Hyperlipidemia, unspecified: Secondary | ICD-10-CM

## 2013-04-09 DIAGNOSIS — E119 Type 2 diabetes mellitus without complications: Secondary | ICD-10-CM

## 2013-04-09 DIAGNOSIS — I10 Essential (primary) hypertension: Secondary | ICD-10-CM

## 2013-04-09 LAB — COMPREHENSIVE METABOLIC PANEL
Albumin: 3.8 g/dL (ref 3.5–5.2)
BUN: 18 mg/dL (ref 6–23)
Calcium: 9 mg/dL (ref 8.4–10.5)
Chloride: 103 mEq/L (ref 96–112)
Creat: 0.88 mg/dL (ref 0.50–1.35)
Glucose, Bld: 115 mg/dL — ABNORMAL HIGH (ref 70–99)
Potassium: 4.2 mEq/L (ref 3.5–5.3)

## 2013-04-09 LAB — GLUCOSE, POCT (MANUAL RESULT ENTRY): POC Glucose: 114 mg/dl — AB (ref 70–99)

## 2013-04-09 LAB — LIPID PANEL
Cholesterol: 216 mg/dL — ABNORMAL HIGH (ref 0–200)
Total CHOL/HDL Ratio: 6 Ratio
Triglycerides: 233 mg/dL — ABNORMAL HIGH (ref <150)

## 2013-04-09 MED ORDER — METFORMIN HCL 500 MG PO TABS: 500.0000 mg | ORAL_TABLET | Freq: Every day | ORAL | Status: DC

## 2013-04-09 MED ORDER — GEMFIBROZIL 600 MG PO TABS: 600.0000 mg | ORAL_TABLET | Freq: Two times a day (BID) | ORAL | Status: DC

## 2013-04-09 MED ORDER — LISINOPRIL 5 MG PO TABS: 5.0000 mg | ORAL_TABLET | Freq: Every day | ORAL | Status: DC

## 2013-04-09 NOTE — Patient Instructions (Signed)
Keep taking all your medications!

## 2013-04-09 NOTE — Progress Notes (Signed)
  Subjective:    Patient ID: Jerry Zimmerman, male    DOB: April 30, 1941, 72 y.o.   MRN: [id number]  HPI  This 72 y.o. male presents for evaluation of DM, HTN, Hyperlipidemia.  He is taking all of his medications as prescribed.   In addition, he reports some back pain, especially with movement, like when he first rises from a chair.  He wants to make sure that the discs in his back are not the problem. No radicular pain, no paresthesias, no lower extremity weakness, no saddle anesthesia, no loss of bowel or bladder control.  Frequency of home glucose monitoring: doesn't check Doesn't see a dentist (has fully compensated full edentula), eye specialist annually (last visit about 2 weeks ago). Checks feet daily. Is not current with influenza vaccine. ("I don't take that.") Is current with pneumococcal vaccine.  Medications, allergies, past medical history, surgical history, family history, social history and problem list reviewed.  Review of Systems Back pain, as above. Denies chest pain, shortness of breath, HA, dizziness, vision change, nausea, vomiting, diarrhea, constipation, melena, hematochezia, dysuria, increased urinary urgency or frequency, increased hunger or thirst, unintentional weight change, rash.     Objective:   Physical Exam Blood pressure 130/78, pulse 78, temperature 98 F (36.7 C), temperature source Oral, resp. rate 16, height 5\' 6"  (1.676 m), weight 184 lb (83.462 kg), SpO2 97.00%. Body mass index is 29.71 kg/(m^2). Well-developed, well nourished [name] who is awake, alert and oriented, in NAD. HEENT: /AT, sclera and conjunctiva are clear.   Neck: supple, non-tender, no lymphadenopathy, thyromegaly. Heart: RRR, no murmur Lungs: normal effort, CTA Extremities: no cyanosis, clubbing or edema. Good strength.  Sensation is symmetric. Back: Non-tender on exam.  FROM.  Skin: warm and dry without rash. Psychologic: good mood and appropriate affect, normal speech and  behavior.  See DM foot exam.  Results for orders placed in visit on 04/09/13  POCT GLYCOSYLATED HEMOGLOBIN (HGB A1C)      Result Value Range   Hemoglobin A1C 6.1    GLUCOSE, POCT (MANUAL RESULT ENTRY)      Result Value Range   POC Glucose 114 (*) 70 - 99 mg/dl         Assessment & Plan:  DIABETES MELLITUS - Plan: POCT glycosylated hemoglobin (Hb A1C), POCT glucose (manual entry), Comprehensive metabolic panel, Microalbumin, urine, metFORMIN (GLUCOPHAGE) 500 MG tablet  Essential hypertension, benign - Plan: lisinopril (PRINIVIL,ZESTRIL) 5 MG tablet  HYPERLIPIDEMIA - Plan: Lipid panel, gemfibrozil (LOPID) 600 MG tablet  Continue with current treatment.  Encouraged flu vaccine, which he declined. RTC 3 months.  Fernande Bras, PA-C Physician Assistant-Certified [facility]

## 2013-04-14 ENCOUNTER — Encounter: Payer: Self-pay | Admitting: Physician Assistant

## 2013-07-14 ENCOUNTER — Encounter: Payer: Self-pay | Admitting: Physician Assistant

## 2013-07-14 ENCOUNTER — Ambulatory Visit (INDEPENDENT_AMBULATORY_CARE_PROVIDER_SITE_OTHER): Payer: BC Managed Care – PPO | Admitting: Physician Assistant

## 2013-07-14 VITALS — BP 154/86 | HR 65 | Temp 97.5°F | Resp 16 | Ht 66.0 in | Wt 189.0 lb

## 2013-07-14 DIAGNOSIS — E785 Hyperlipidemia, unspecified: Secondary | ICD-10-CM

## 2013-07-14 DIAGNOSIS — I10 Essential (primary) hypertension: Secondary | ICD-10-CM

## 2013-07-14 DIAGNOSIS — E119 Type 2 diabetes mellitus without complications: Secondary | ICD-10-CM

## 2013-07-14 DIAGNOSIS — E669 Obesity, unspecified: Secondary | ICD-10-CM

## 2013-07-14 LAB — GLUCOSE, POCT (MANUAL RESULT ENTRY): POC Glucose: 92 mg/dl (ref 70–99)

## 2013-07-14 MED ORDER — LISINOPRIL 5 MG PO TABS: 5.0000 mg | ORAL_TABLET | Freq: Every day | ORAL | Status: DC

## 2013-07-14 MED ORDER — METFORMIN HCL 500 MG PO TABS: 500.0000 mg | ORAL_TABLET | Freq: Every day | ORAL | Status: DC

## 2013-07-14 MED ORDER — GEMFIBROZIL 600 MG PO TABS: 600.0000 mg | ORAL_TABLET | Freq: Two times a day (BID) | ORAL | Status: DC

## 2013-07-14 NOTE — Patient Instructions (Signed)
Be sure you take all your medications every day.

## 2013-07-14 NOTE — Progress Notes (Signed)
   Subjective:    Patient ID: Jerry Zimmerman, male    DOB: 1941/03/25, 72 y.o.   MRN: [id number]  Chief Complaint  Patient presents with  . Follow-up    diabetes, htn, hyperlipidemia    HPI  Asks that the outside labs not be done today, due to the $150 bill he received for the last ones.  Hasn't taken any meds today.  Medications, allergies, past medical history, surgical history, family history, social history and problem list reviewed and updated.  Frequency of home glucose monitoring: doesn't check  Doesn't see a dentist (has fully compensated full edentula), eye specialist annually (last visit 03/12/2013).  Checks feet daily.  Is not current with influenza vaccine. ("I don't take that.")  Is current with pneumococcal vaccine.  Review of Systems Some stiffness and mild pain in the low back upon waking in the mornings, which resolves with movement.    Objective:   Physical Exam Blood pressure 154/86, pulse 65, temperature 97.5 F (36.4 C), temperature source Oral, resp. rate 16, height 5\' 6"  (1.676 m), weight 189 lb (85.73 kg), SpO2 99.00%. Body mass index is 30.52 kg/(m^2). Well-developed, well nourished [name] who is awake, alert and oriented, in NAD. HEENT: /AT, sclera and conjunctiva are clear.   Neck: supple, non-tender, no lymphadenopathy, thyromegaly. Heart: RRR, no murmur Lungs: normal effort, CTA Extremities: no cyanosis, clubbing or edema. Skin: warm and dry without rash. Psychologic: good mood and appropriate affect, normal speech and behavior.  See DM foot exam.       Assessment & Plan:  Type II or unspecified type diabetes mellitus without mention of complication, not stated as uncontrolled - Controlled. Plan: POCT glucose (manual entry), POCT glycosylated hemoglobin (Hb A1C), HM Diabetes Foot Exam, CANCELED: Comprehensive metabolic panel; metFORMIN (GLUCOPHAGE) 500 MG tablet  Other and unspecified hyperlipidemia - Plan: CANCELED: Lipid panel;  gemfibrozil (LOPID) 600 MG tablet  Essential hypertension, benign - controlled. Plan: lisinopril (PRINIVIL,ZESTRIL) 5 MG tablet  Obesity - continue efforts for weight loss through healthier eating and regular exercise.  Fernande Bras, PA-C Physician Assistant-Certified [facility]

## 2013-10-22 ENCOUNTER — Ambulatory Visit (INDEPENDENT_AMBULATORY_CARE_PROVIDER_SITE_OTHER): Payer: 59 | Admitting: Physician Assistant

## 2013-10-22 ENCOUNTER — Encounter: Payer: Self-pay | Admitting: Physician Assistant

## 2013-10-22 VITALS — BP 142/82 | HR 72 | Temp 98.0°F | Resp 18 | Wt 187.0 lb

## 2013-10-22 DIAGNOSIS — E785 Hyperlipidemia, unspecified: Secondary | ICD-10-CM

## 2013-10-22 DIAGNOSIS — E119 Type 2 diabetes mellitus without complications: Secondary | ICD-10-CM

## 2013-10-22 DIAGNOSIS — Z1211 Encounter for screening for malignant neoplasm of colon: Secondary | ICD-10-CM

## 2013-10-22 DIAGNOSIS — Z0000 Encounter for general adult medical examination without abnormal findings: Secondary | ICD-10-CM

## 2013-10-22 DIAGNOSIS — E669 Obesity, unspecified: Secondary | ICD-10-CM

## 2013-10-22 DIAGNOSIS — I10 Essential (primary) hypertension: Secondary | ICD-10-CM

## 2013-10-22 LAB — POCT URINALYSIS DIPSTICK
Bilirubin, UA: NEGATIVE
Blood, UA: NEGATIVE
Glucose, UA: NEGATIVE
Ketones, UA: NEGATIVE
Leukocytes, UA: NEGATIVE
Nitrite, UA: NEGATIVE
PH UA: 5.5
PROTEIN UA: NEGATIVE
UROBILINOGEN UA: 0.2

## 2013-10-22 LAB — COMPLETE METABOLIC PANEL WITH GFR
ALBUMIN: 3.6 g/dL (ref 3.5–5.2)
ALT: 14 U/L (ref 0–53)
AST: 19 U/L (ref 0–37)
Alkaline Phosphatase: 59 U/L (ref 39–117)
BUN: 10 mg/dL (ref 6–23)
CALCIUM: 8.8 mg/dL (ref 8.4–10.5)
CHLORIDE: 101 meq/L (ref 96–112)
CO2: 26 meq/L (ref 19–32)
Creat: 0.75 mg/dL (ref 0.50–1.35)
GFR, Est Non African American: >89 mL/min
Glucose, Bld: 117 mg/dL — ABNORMAL HIGH (ref 70–99)
POTASSIUM: 4 meq/L (ref 3.5–5.3)
Sodium: 136 mEq/L (ref 135–145)
Total Bilirubin: 0.7 mg/dL (ref 0.2–1.2)
Total Protein: 7 g/dL (ref 6.0–8.3)

## 2013-10-22 LAB — CBC WITH DIFFERENTIAL/PLATELET
Basophils Absolute: 0 10*3/uL (ref 0.0–0.1)
Basophils Relative: 1 % (ref 0–1)
Eosinophils Absolute: 0.2 10*3/uL (ref 0.0–0.7)
Eosinophils Relative: 7 % — ABNORMAL HIGH (ref 0–5)
HEMATOCRIT: 40.6 % (ref 39.0–52.0)
HEMOGLOBIN: 14.2 g/dL (ref 13.0–17.0)
LYMPHS ABS: 1.4 10*3/uL (ref 0.7–4.0)
LYMPHS PCT: 43 % (ref 12–46)
MCH: 31.7 pg (ref 26.0–34.0)
MCHC: 35 g/dL (ref 30.0–36.0)
MCV: 90.6 fL (ref 78.0–100.0)
MONO ABS: 0.5 10*3/uL (ref 0.1–1.0)
Monocytes Relative: 14 % — ABNORMAL HIGH (ref 3–12)
Neutro Abs: 1.2 10*3/uL — ABNORMAL LOW (ref 1.7–7.7)
Neutrophils Relative %: 35 % — ABNORMAL LOW (ref 43–77)
Platelets: 268 10*3/uL (ref 150–400)
RBC: 4.48 MIL/uL (ref 4.22–5.81)
RDW: 13.1 % (ref 11.5–15.5)
WBC: 3.3 10*3/uL — AB (ref 4.0–10.5)

## 2013-10-22 LAB — GLUCOSE, POCT (MANUAL RESULT ENTRY): POC GLUCOSE: 122 mg/dL — AB (ref 70–99)

## 2013-10-22 LAB — IFOBT (OCCULT BLOOD): IFOBT: NEGATIVE

## 2013-10-22 LAB — POCT UA - MICROSCOPIC ONLY
CASTS, UR, LPF, POC: NEGATIVE
Crystals, Ur, HPF, POC: NEGATIVE
Epithelial cells, urine per micros: NEGATIVE
MUCUS UA: NEGATIVE
RBC, urine, microscopic: NEGATIVE
YEAST UA: NEGATIVE

## 2013-10-22 LAB — LIPID PANEL
Cholesterol: 216 mg/dL — ABNORMAL HIGH (ref 0–200)
HDL: 31 mg/dL — AB (ref >39)
LDL Cholesterol: 138 mg/dL — ABNORMAL HIGH (ref 0–99)
Total CHOL/HDL Ratio: 7 Ratio
Triglycerides: 237 mg/dL — ABNORMAL HIGH (ref <150)
VLDL: 47 mg/dL — ABNORMAL HIGH (ref 0–40)

## 2013-10-22 LAB — POCT GLYCOSYLATED HEMOGLOBIN (HGB A1C): Hemoglobin A1C: 6.5

## 2013-10-22 MED ORDER — LISINOPRIL 5 MG PO TABS: 5.0000 mg | ORAL_TABLET | Freq: Every day | ORAL | Status: DC

## 2013-10-22 NOTE — Patient Instructions (Signed)
I will contact you with your lab results as soon as they are available.   If you have not heard from me in 2 weeks, please contact me.  The fastest way to get your results is to register for My Chart (see the instructions on the last page of this printout).  Keeping you healthy  Get these tests  Blood pressure- Have your blood pressure checked once a year by your healthcare provider.  Normal blood pressure is 120/80  Weight- Have your body mass index (BMI) calculated to screen for obesity.  BMI is a measure of body fat based on height and weight. You can also calculate your own BMI at [email or web address]/[email or web address]/.  Cholesterol- Have your cholesterol checked every year.  Diabetes- Have your blood sugar checked regularly if you have high blood pressure, high cholesterol, have a family history of diabetes or if you are overweight.  Screening for Colon Cancer- Colonoscopy starting at age 50.  Screening may begin sooner depending on your family history and other health conditions. Follow up colonoscopy as directed by your Gastroenterologist.  Screening for Prostate Cancer- Both blood work (PSA) and a rectal exam help screen for Prostate Cancer.  Screening begins at age 40 with African-American men and at age 50 with Caucasian men.  Screening may begin sooner depending on your family history.  Take these medicines  Aspirin- One aspirin daily can help prevent Heart disease and Stroke.  Flu shot- Every fall.  Tetanus- Every 10 years.  Zostavax- Once after the age of 60 to prevent Shingles.  Pneumonia shot- Once after the age of 65; if you are younger than 65, ask your healthcare provider if you need a Pneumonia shot.  Take these steps  Don't smoke- If you do smoke, talk to your doctor about quitting.  For tips on how to quit, go to www.smokefree.gov or call 1-800-QUIT-NOW.  Be physically active- Exercise 5 days a week for at least 30 minutes.  If you are not already physically active start  slow and gradually work up to 30 minutes of moderate physical activity.  Examples of moderate activity include walking briskly, mowing the yard, dancing, swimming, bicycling, etc.  Eat a healthy diet- Eat a variety of healthy food such as fruits, vegetables, low fat milk, low fat cheese, yogurt, lean meant, poultry, fish, beans, tofu, etc. For more information go to www.thenutritionsource.org  Drink alcohol in moderation- Limit alcohol intake to less than two drinks a day. Never drink and drive.  Dentist- Brush and floss twice daily; visit your dentist twice a year.  Depression- Your emotional health is as important as your physical health. If you're feeling down, or losing interest in things you would normally enjoy please talk to your healthcare provider.  Eye exam- Visit your eye doctor every year.  Safe sex- If you may be exposed to a sexually transmitted infection, use a condom.  Seat belts- Seat belts can save your life; always wear one.  Smoke/Carbon Monoxide detectors- These detectors need to be installed on the appropriate level of your home.  Replace batteries at least once a year.  Skin cancer- When out in the sun, cover up and use sunscreen 15 SPF or higher.  Violence- If anyone is threatening you, please tell your healthcare provider.  Living Will/ Health care power of attorney- Speak with your healthcare provider and family. 

## 2013-10-22 NOTE — Progress Notes (Signed)
Subjective:    Patient ID: Jerry Zimmerman, male    DOB: 1941-06-17, 73 y.o.   MRN: [id number]   PCP: Erisa Mehlman, PA-C  Chief Complaint  Patient presents with  . Diabetes    recheck   . Hypertension    needs lisinopril refill      Active Ambulatory Problems    Diagnosis Date Noted  . DIABETES MELLITUS 05/16/2009  . HYPERLIPIDEMIA 05/16/2009  . SLEEP APNEA 05/17/2009  . Essential hypertension, benign   . Obesity    Resolved Ambulatory Problems    Diagnosis Date Noted  . No Resolved Ambulatory Problems   Past Medical History  Diagnosis Date  . Hyperlipidemia   . Hyperglycemia   . Diabetes mellitus without complication     Past Surgical History  Procedure Laterality Date  . Inguinal hernia repair      right  . Appendectomy    . Hemorrhoid surgery  1973    No Known Allergies  Prior to Admission medications   Medication Sig Start Date End Date Taking? Authorizing Provider  aspirin 81 MG tablet Take 81 mg by mouth daily.   Yes Historical Provider, MD  gemfibrozil (LOPID) 600 MG tablet Take 1 tablet (600 mg total) by mouth 2 (two) times daily before a meal. 07/14/13  Yes Lajeana Strough S Fe Okubo, PA-C  lisinopril (PRINIVIL,ZESTRIL) 5 MG tablet Take 1 tablet (5 mg total) by mouth daily. 10/22/13  Yes Doshie Maggi S Alpha Chouinard, PA-C  metFORMIN (GLUCOPHAGE) 500 MG tablet Take 1 tablet (500 mg total) by mouth daily with breakfast. 07/14/13  Yes Fernande Bras, PA-C    History   Social History  . Marital Status: Married    Spouse Name: N/A    Number of Children: 4  . Years of Education: 14   Occupational History  . Quality Programmer, multimedia (pill maufacturing)   Social History Main Topics  . Smoking status: Former Smoker -- 3.00 packs/day for 18 years    Types: Cigarettes    Quit date: 08/13/1987  . Smokeless tobacco: Never Used  . Alcohol Use: No  . Drug Use: No  . Sexual Activity: Yes    Partners: Female   Other Topics Concern  . None   Social History  Narrative   Originally from [place], one of 12 siblings.   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             family history includes Hyperlipidemia in his father. There is no history of Colon cancer. indicated that his mother is deceased. He indicated that his father is deceased. He indicated that all of his seven sisters are alive. He indicated that four of his five brothers are alive. He indicated that both of his daughters are alive. He indicated that both of his sons are alive.   HPI  Presents for annual wellness exam.  Frequency of home glucose monitoring: doesn't check  Doesn't see a dentist (has fully compensated edentula), eye specialist annually (last visit 03/12/2013).  Checks feet daily.  Is not current with influenza vaccine. ("I don't take that.")  Is current with pneumococcal vaccine. Is not interested in the shingles vaccine.   Review of Systems  HENT: Positive for hearing loss, sneezing and sore throat.   Eyes: Positive for visual disturbance.  Respiratory: Negative.   Cardiovascular: Negative.   Endocrine: Negative.   Genitourinary: Negative.   Musculoskeletal: Positive for  arthralgias and back pain.       More muscle soreness after working than when he was younger.  Skin: Negative.   Allergic/Immunologic: Negative.   Neurological: Negative.        "I have a very good memory"  Hematological: Negative.   Psychiatric/Behavioral: Negative.        Objective:   Physical Exam  Vitals reviewed. Constitutional: He is oriented to person, place, and time. He appears well-developed and well-nourished. He is active and cooperative.  Non-toxic appearance. He does not have a sickly appearance. He does not appear ill. No distress.  BP 142/82  Pulse 72  Temp(Src) 98 F (36.7 C)  Resp 18  Wt 187 lb (84.823 kg)  SpO2 99%   HENT:  Head: Normocephalic and atraumatic.  Right Ear:  Hearing, tympanic membrane, external ear and ear canal normal.  Left Ear: Hearing, tympanic membrane, external ear and ear canal normal.  Nose: Nose normal.  Mouth/Throat: Uvula is midline, oropharynx is clear and moist and mucous membranes are normal. He does not have dentures. No oral lesions. No trismus in the jaw. Normal dentition. No dental abscesses, uvula swelling, lacerations or dental caries.  Eyes: Conjunctivae, EOM and lids are normal. Pupils are equal, round, and reactive to light. Right eye exhibits no discharge. Left eye exhibits no discharge. No scleral icterus.  Fundoscopic exam:      The right eye shows no arteriolar narrowing, no AV nicking, no exudate, no hemorrhage and no papilledema.       The left eye shows no arteriolar narrowing, no AV nicking, no exudate, no hemorrhage and no papilledema.  Neck: Normal range of motion, full passive range of motion without pain and phonation normal. Neck supple. No spinous process tenderness and no muscular tenderness present. No rigidity. No tracheal deviation, no edema, no erythema and normal range of motion present. No thyromegaly present.  Cardiovascular: Normal rate, regular rhythm, S1 normal, S2 normal, normal heart sounds, intact distal pulses and normal pulses.  Exam reveals no gallop and no friction rub.   No murmur heard. Pulmonary/Chest: Effort normal and breath sounds normal. No respiratory distress. He has no wheezes. He has no rales.  Abdominal: Soft. Normal appearance and bowel sounds are normal. He exhibits no distension and no mass. There is no hepatosplenomegaly. There is no tenderness. There is no rebound and no guarding. No hernia. Hernia confirmed negative in the right inguinal area and confirmed negative in the left inguinal area.  Genitourinary: Rectum normal, prostate normal, testes normal and penis normal. Guaiac negative stool. Circumcised. No phimosis, paraphimosis, hypospadias, penile erythema or penile tenderness. No  discharge found.  Musculoskeletal: Normal range of motion. He exhibits no edema and no tenderness.       Right shoulder: Normal.       Left shoulder: Normal.       Right elbow: Normal.      Left elbow: Normal.       Right wrist: Normal.       Left wrist: Normal.       Right hip: Normal.       Left hip: Normal.       Right knee: Normal.       Left knee: Normal.       Right ankle: Normal. Achilles tendon normal.       Left ankle: Normal. Achilles tendon normal.       Cervical back: Normal. He exhibits normal range of motion, no tenderness, no bony tenderness, no  swelling, no edema, no deformity, no laceration, no pain, no spasm and normal pulse.       Thoracic back: Normal.       Lumbar back: Normal.       Right upper arm: Normal.       Left upper arm: Normal.       Right forearm: Normal.       Left forearm: Normal.       Right hand: Normal.       Left hand: Normal.       Right upper leg: Normal.       Left upper leg: Normal.       Right lower leg: Normal.       Left lower leg: Normal.       Right foot: Normal.       Left foot: Normal.  Lymphadenopathy:       Head (right side): No submental, no submandibular, no tonsillar, no preauricular, no posterior auricular and no occipital adenopathy present.       Head (left side): No submental, no submandibular, no tonsillar, no preauricular, no posterior auricular and no occipital adenopathy present.    He has no cervical adenopathy.       Right: No inguinal and no supraclavicular adenopathy present.       Left: No inguinal and no supraclavicular adenopathy present.  Neurological: He is alert and oriented to person, place, and time. He has normal strength and normal reflexes. He displays no tremor. No cranial nerve deficit. He exhibits normal muscle tone. Coordination and gait normal.  Skin: Skin is warm, dry and intact. No abrasion, no ecchymosis, no laceration, no lesion and no rash noted. He is not diaphoretic. No cyanosis or erythema.  No pallor. Nails show no clubbing.  Psychiatric: He has a normal mood and affect. His speech is normal and behavior is normal. Judgment and thought content normal. Cognition and memory are normal.   Results for orders placed in visit on 10/22/13  POCT GLYCOSYLATED HEMOGLOBIN (HGB A1C)      Result Value Ref Range   Hemoglobin A1C 6.5    GLUCOSE, POCT (MANUAL RESULT ENTRY)      Result Value Ref Range   POC Glucose 122 (*) 70 - 99 mg/dl  POCT URINALYSIS DIPSTICK      Result Value Ref Range   Color, UA yellow     Clarity, UA cleaR     Glucose, UA NEG     Bilirubin, UA NEG     Ketones, UA NEG     Spec Grav, UA >=1.030     Blood, UA NEG     pH, UA 5.5     Protein, UA NEG     Urobilinogen, UA 0.2     Nitrite, UA NEG     Leukocytes, UA Negative    POCT UA - MICROSCOPIC ONLY      Result Value Ref Range   WBC, Ur, HPF, POC 0-1     RBC, urine, microscopic NEG     Bacteria, U Microscopic TRACE     Mucus, UA NEG     Epithelial cells, urine per micros NEG     Crystals, Ur, HPF, POC NEG     Casts, Ur, LPF, POC NEG     Yeast, UA NEG    IFOBT (OCCULT BLOOD)      Result Value Ref Range   IFOBT Negative            Assessment & Plan:  1. Annual physical exam Age appropriate anticipatory guidance provided.  2. Type II or unspecified type diabetes mellitus without mention of complication, not stated as uncontrolled Well-controlled. Continue metformin.  - POCT glycosylated hemoglobin (Hb A1C) - POCT glucose (manual entry) - POCT urinalysis dipstick - Microalbumin, urine - POCT UA - Microscopic Only - HM Diabetes Foot Exam  3. Other and unspecified hyperlipidemia Await labs.  Continue current treatment, adjust as indicated. - Lipid panel  4. Essential hypertension, benign Controlled. Continue current treatment. - CBC with Differential - COMPLETE METABOLIC PANEL WITH GFR - POCT urinalysis dipstick - Microalbumin, urine - POCT UA - Microscopic Only - lisinopril  (PRINIVIL,ZESTRIL) 5 MG tablet; Take 1 tablet (5 mg total) by mouth daily.  Dispense: 90 tablet; Refill: 3  5. Obesity Healthy eating and regular exercise.  6. Screening for colon cancer - IFOBT POC (occult bld, rslt in office)   Fernande Brashelle S. Diannah Rindfleisch, PA-C Physician Assistant-Certified [facility]

## 2013-10-23 LAB — MICROALBUMIN, URINE: MICROALB UR: 2.17 mg/dL — AB (ref 0.00–1.89)

## 2013-10-25 ENCOUNTER — Encounter: Payer: Self-pay | Admitting: Physician Assistant

## 2014-02-23 ENCOUNTER — Telehealth: Payer: Self-pay

## 2014-02-23 ENCOUNTER — Telehealth: Payer: Self-pay | Admitting: Radiology

## 2014-02-23 NOTE — Telephone Encounter (Signed)
Telephone call from patient wanting to schedule an appointment with Jerry Zimmerman.  Advised patient that [name] is not taking any appointments until January due to staffing issues and [facility] or he can follow up with another provider by appointment.  He said that [name] has been his primary provider for five years now and does not want to follow up with anyone else.  He said he will see her at the walk in clinic.  I gave him [name]'s hours. He was very Adult nurseappreciative.

## 2014-02-23 NOTE — Telephone Encounter (Signed)
Phone call to patient. He needs additional labs/ due to his diabetes left message for him to call back to schedule an appointment

## 2014-02-24 ENCOUNTER — Ambulatory Visit (INDEPENDENT_AMBULATORY_CARE_PROVIDER_SITE_OTHER): Payer: 59 | Admitting: Family Medicine

## 2014-02-24 ENCOUNTER — Ambulatory Visit (INDEPENDENT_AMBULATORY_CARE_PROVIDER_SITE_OTHER): Payer: 59

## 2014-02-24 VITALS — BP 132/80 | HR 74 | Temp 97.8°F | Resp 16 | Ht 66.5 in | Wt 185.0 lb

## 2014-02-24 DIAGNOSIS — R059 Cough, unspecified: Secondary | ICD-10-CM

## 2014-02-24 DIAGNOSIS — R05 Cough: Secondary | ICD-10-CM

## 2014-02-24 DIAGNOSIS — M25561 Pain in right knee: Secondary | ICD-10-CM

## 2014-02-24 DIAGNOSIS — I8390 Asymptomatic varicose veins of unspecified lower extremity: Secondary | ICD-10-CM

## 2014-02-24 DIAGNOSIS — M25569 Pain in unspecified knee: Secondary | ICD-10-CM

## 2014-02-24 DIAGNOSIS — E785 Hyperlipidemia, unspecified: Secondary | ICD-10-CM

## 2014-02-24 DIAGNOSIS — I8392 Asymptomatic varicose veins of left lower extremity: Secondary | ICD-10-CM

## 2014-02-24 DIAGNOSIS — E119 Type 2 diabetes mellitus without complications: Secondary | ICD-10-CM

## 2014-02-24 DIAGNOSIS — I10 Essential (primary) hypertension: Secondary | ICD-10-CM

## 2014-02-24 LAB — COMPREHENSIVE METABOLIC PANEL
ALBUMIN: 4.1 g/dL (ref 3.5–5.2)
ALT: 21 U/L (ref 0–53)
AST: 22 U/L (ref 0–37)
Alkaline Phosphatase: 58 U/L (ref 39–117)
BILIRUBIN TOTAL: 0.7 mg/dL (ref 0.2–1.2)
BUN: 9 mg/dL (ref 6–23)
CALCIUM: 8.8 mg/dL (ref 8.4–10.5)
CO2: 24 mEq/L (ref 19–32)
Chloride: 101 mEq/L (ref 96–112)
Creat: 0.82 mg/dL (ref 0.50–1.35)
GLUCOSE: 115 mg/dL — AB (ref 70–99)
POTASSIUM: 4.3 meq/L (ref 3.5–5.3)
Sodium: 134 mEq/L — ABNORMAL LOW (ref 135–145)
TOTAL PROTEIN: 7 g/dL (ref 6.0–8.3)

## 2014-02-24 LAB — LIPID PANEL
CHOLESTEROL: 322 mg/dL — AB (ref 0–200)
HDL: 31 mg/dL — ABNORMAL LOW (ref >39)
TRIGLYCERIDES: 621 mg/dL — AB (ref <150)
Total CHOL/HDL Ratio: 10.4 Ratio

## 2014-02-24 LAB — POCT GLYCOSYLATED HEMOGLOBIN (HGB A1C): Hemoglobin A1C: 6

## 2014-02-24 LAB — GLUCOSE, POCT (MANUAL RESULT ENTRY): POC Glucose: 103 mg/dl — AB (ref 70–99)

## 2014-02-24 MED ORDER — AMOXICILLIN 875 MG PO TABS: 875.0000 mg | ORAL_TABLET | Freq: Two times a day (BID) | ORAL | Status: DC

## 2014-02-24 MED ORDER — BENZONATATE 100 MG PO CAPS: 100.0000 mg | ORAL_CAPSULE | Freq: Three times a day (TID) | ORAL | Status: DC | PRN

## 2014-02-24 MED ORDER — IPRATROPIUM BROMIDE 0.03 % NA SOLN: 2.0000 | Freq: Two times a day (BID) | NASAL | Status: DC

## 2014-02-24 NOTE — Progress Notes (Signed)
Subjective:    Patient ID: Jerry Zimmerman, male    DOB: 06/06/1941, 73 y.o.   MRN: [id number]   PCP: Demosthenes Virnig, PA-C  Chief Complaint  Patient presents with  . Diabetic check    Pt wants to have his a1C checked  . Cough    x742months, had a cold recently, refuses to "go away", productive cough    Medications, allergies, past medical history, surgical history, family history, social history and problem list reviewed and updated.  Patient Active Problem List   Diagnosis Date Noted  . Essential hypertension, benign   . Obesity   . SLEEP APNEA 05/17/2009  . DIABETES MELLITUS 05/16/2009  . HYPERLIPIDEMIA 05/16/2009    Prior to Admission medications   Medication Sig Start Date End Date Taking? Authorizing Provider  aspirin 81 MG tablet Take 81 mg by mouth daily.   Yes Historical Provider, MD  gemfibrozil (LOPID) 600 MG tablet Take 1 tablet (600 mg total) by mouth 2 (two) times daily before a meal. 07/14/13  Yes Vidalia Serpas S Indiana Pechacek, PA-C  lisinopril (PRINIVIL,ZESTRIL) 5 MG tablet Take 1 tablet (5 mg total) by mouth daily. 10/22/13  Yes Christiaan Strebeck S Abrea Henle, PA-C  metFORMIN (GLUCOPHAGE) 500 MG tablet Take 1 tablet (500 mg total) by mouth daily with breakfast. 07/14/13  Yes Manuella Blackson Tessa LernerS Jeree Delcid, PA-C    HPI  Presents to follow-up DM and hyperlipidemia, and also to discuss several other issues today.  Currently in a period of fasting (Ramadan), which ends soon.  Cough, nasal congestion x 2 months since he had a :cold."  It just won't seem to go away.  He doesn't feel particularly bad, but he's tired of the symptoms.  Cough is non-productive.  No associated fevers, chills, GI changes. He has not tried any OTC products.  RIGHT lateral knee pain x "some long time."  Makes kneeling for prayer very painful-has been praying in a chair.  Frequency of home glucose monitoring: doesn't check  Doesn't see a dentist (has fully compensated edentula), eye specialist annually (last visit 03/12/2013, just  received message to schedule annual visit).  Checks feet daily.  Is not current with influenza vaccine. ("I don't take that.")  Is current with pneumococcal (pneumovax) vaccine. Will receive Prevnar-13 once the fast is complete. Is not interested in the shingles vaccine.   Review of Systems As above. Denies chest pain, shortness of breath, HA, dizziness, vision change, nausea, vomiting, diarrhea, constipation, melena, hematochezia, dysuria, increased urinary urgency or frequency, increased hunger or thirst, unintentional weight change, other unexplained myalgias or arthralgias, rash.     Objective:   Physical Exam  Vitals reviewed. Constitutional: He is oriented to person, place, and time. Vital signs are normal. He appears well-developed and well-nourished. He is active and cooperative. No distress.  BP 132/80  Pulse 74  Temp(Src) 97.8 F (36.6 C) (Oral)  Resp 16  Ht 5' 6.5" (1.689 m)  Wt 185 lb (83.915 kg)  BMI 29.42 kg/m2  SpO2 98%  HENT:  Head: Normocephalic and atraumatic.  Right Ear: Hearing, tympanic membrane, external ear and ear canal normal.  Left Ear: Hearing, tympanic membrane, external ear and ear canal normal.  Nose: Nose normal.  Mouth/Throat: Uvula is midline, oropharynx is clear and moist and mucous membranes are normal.  Eyes: Conjunctivae are normal. No scleral icterus.  Neck: Normal range of motion. Neck supple. No thyromegaly present.  Cardiovascular: Normal rate, regular rhythm and normal heart sounds.   Pulses:      Radial pulses are  2+ on the right side, and 2+ on the left side.  Significant varicose veins in the lower extremities  Pulmonary/Chest: Effort normal and breath sounds normal.  Musculoskeletal:       Right knee: He exhibits normal range of motion, no swelling, no effusion, no ecchymosis, no deformity and no LCL laxity. Tenderness found. Lateral joint line tenderness noted. No medial joint line, no MCL, no LCL and no patellar tendon tenderness  noted.       Left knee: Normal.       Right ankle: Normal.       Left ankle: Normal.       Legs: Mild crepitus bilateral knees  Lymphadenopathy:       Head (right side): No tonsillar, no preauricular, no posterior auricular and no occipital adenopathy present.       Head (left side): No tonsillar, no preauricular, no posterior auricular and no occipital adenopathy present.    He has no cervical adenopathy.       Right: No supraclavicular adenopathy present.       Left: No supraclavicular adenopathy present.  Neurological: He is alert and oriented to person, place, and time. No sensory deficit.  Skin: Skin is warm, dry and intact. No rash noted. No cyanosis or erythema. Nails show no clubbing.  Psychiatric: He has a normal mood and affect.   See diabetic foot exam.    Results for orders placed in visit on 02/24/14  GLUCOSE, POCT (MANUAL RESULT ENTRY)      Result Value Ref Range   POC Glucose 103 (*) 70 - 99 mg/dl  POCT GLYCOSYLATED HEMOGLOBIN (HGB A1C)      Result Value Ref Range   Hemoglobin A1C 6.0     CXR: [facility] reading (PRIMARY) by  Dr. Patsy Lager.  Normal CXR without infiltrates or masses.      Assessment & Plan:  1. DIABETES MELLITUS Controlled.  Continue current treatment. Suspect that fasting this month has helped. - POCT glucose (manual entry) - POCT glycosylated hemoglobin (Hb A1C) - Comprehensive metabolic panel - Microalbumin, urine  2. HYPERLIPIDEMIA Await lab.  Continue treatment. - Lipid panel  3. Essential hypertension, benign Controlled.  Continue. I doubt his cough is related to the ACE, but consider change to ARB if cough persists.  4. Varicose veins of left lower extremity He is not bothered by these presently and will let me know when he decides he'd like to consider treatment.  5. Knee pain, right He declined an xray today.  Suspect degenerative changes.  He's also not interested in NSAIDS.  6. Cough Subacute sinusitis.  Doubt ACEI cough, but if  persists, will switch to ARB to treat his HTN. - DG Chest 2 View; Future - amoxicillin (AMOXIL) 875 MG tablet; Take 1 tablet (875 mg total) by mouth 2 (two) times daily.  Dispense: 20 tablet; Refill: 0 - ipratropium (ATROVENT) 0.03 % nasal spray; Place 2 sprays into both nostrils 2 (two) times daily.  Dispense: 30 mL; Refill: 0 - benzonatate (TESSALON) 100 MG capsule; Take 1-2 capsules (100-200 mg total) by mouth 3 (three) times daily as needed for cough.  Dispense: 40 capsule; Refill: 0  Return in about 3 months (around 05/27/2014) for re-evaluation and fasting labs.  Fernande Bras, PA-C Physician Assistant-Certified [facility]

## 2014-02-24 NOTE — Patient Instructions (Signed)
Your blood sugar looks GREAT! Keep up the good work!  I will contact you with your lab results as soon as they are available.   If you have not heard from me in 2 weeks, please contact me.  The fastest way to get your results is to register for My Chart (see the instructions on the last page of this printout).  If your cough doesn't go away with the treatment in 2 weeks, please let me know.

## 2014-02-25 ENCOUNTER — Encounter: Payer: Self-pay | Admitting: Physician Assistant

## 2014-02-25 LAB — MICROALBUMIN, URINE: Microalb, Ur: 1.26 mg/dL (ref 0.00–1.89)

## 2014-03-30 ENCOUNTER — Other Ambulatory Visit: Payer: Self-pay | Admitting: Physician Assistant

## 2014-04-01 ENCOUNTER — Telehealth: Payer: Self-pay

## 2014-04-01 NOTE — Telephone Encounter (Signed)
Pt dropped off Biometric screening form. Completed form with data from most recent visit after discussing w/Sarah, and put in [name]'s box for signature. Pt has not had waist measured.

## 2014-04-01 NOTE — Telephone Encounter (Signed)
I signed the form and put it in [place] box at the nurses' desk.  We can measure the waist circumference when the patient comes to pick it up.

## 2014-04-02 NOTE — Telephone Encounter (Signed)
LM form in pick up drawer. Pt needs waist measurement to have the form completed. Note on envelope for pt.

## 2014-04-21 ENCOUNTER — Ambulatory Visit (INDEPENDENT_AMBULATORY_CARE_PROVIDER_SITE_OTHER): Payer: 59

## 2014-04-21 ENCOUNTER — Ambulatory Visit (INDEPENDENT_AMBULATORY_CARE_PROVIDER_SITE_OTHER): Payer: 59 | Admitting: Family Medicine

## 2014-04-21 VITALS — BP 126/74 | HR 76 | Temp 97.7°F | Resp 16 | Ht 66.0 in | Wt 182.0 lb

## 2014-04-21 DIAGNOSIS — M1710 Unilateral primary osteoarthritis, unspecified knee: Secondary | ICD-10-CM

## 2014-04-21 DIAGNOSIS — M25562 Pain in left knee: Principal | ICD-10-CM

## 2014-04-21 DIAGNOSIS — M25569 Pain in unspecified knee: Secondary | ICD-10-CM

## 2014-04-21 DIAGNOSIS — M25561 Pain in right knee: Secondary | ICD-10-CM

## 2014-04-21 DIAGNOSIS — M170 Bilateral primary osteoarthritis of knee: Secondary | ICD-10-CM

## 2014-04-21 DIAGNOSIS — IMO0002 Reserved for concepts with insufficient information to code with codable children: Secondary | ICD-10-CM

## 2014-04-21 MED ORDER — MELOXICAM 15 MG PO TABS: 15.0000 mg | ORAL_TABLET | Freq: Every day | ORAL | Status: DC

## 2014-04-21 NOTE — Progress Notes (Signed)
Subjective:    Patient ID: Jerry Zimmerman, male    DOB: [birth year], 74 y.o.   MRN: [id number]   PCP: Timiko Offutt, PA-C  Chief Complaint  Patient presents with  . Knee Pain    Left knee pain, hurts when the pt. sits down for a long period of time     Medications, allergies, past medical history, surgical history, family history, social history and problem list reviewed and updated.  Patient Active Problem List   Diagnosis Date Noted  . 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 Essential hypertension, benign   . Obesity   . SLEEP APNEA 05/17/2009  . DIABETES MELLITUS 05/16/2009  . HYPERLIPIDEMIA 05/16/2009    Prior to Admission medications   Medication Sig Start Date End Date Taking? Authorizing Provider  aspirin 81 MG tablet Take 81 mg by mouth daily.   Yes Historical Provider, MD  gemfibrozil (LOPID) 600 MG tablet Take 1 tablet (600 mg total) by mouth 2 (two) times daily before a meal. 07/14/13  Yes Zamyah Wiesman S Clotilde Loth, PA-C  ipratropium (ATROVENT) 0.03 % nasal spray Place 2 sprays into both nostrils 2 (two) times daily. 02/24/14  Yes Ronnita Paz S Tessica Cupo, PA-C  lisinopril (PRINIVIL,ZESTRIL) 5 MG tablet Take 1 tablet (5 mg total) by mouth daily. 10/22/13  Yes Ericha Whittingham S Aerith Canal, PA-C  metFORMIN (GLUCOPHAGE) 500 MG tablet TAKE ONE TABLET BY MOUTH ONCE DAILY WITH BREAKFAST 03/31/14  Yes Pearline Cables, MD    Knee Pain     This 73 y.o. male presents for evaluation of knee pain.  At his visit with me last month he reported RIGHT knee pain that prevented him from kneeling for prayer.  His mosque had approved his practice of praying seated in a chair, and he did not want to have xrays and did not want medication at that time.  Since then, the pain has developed in the LEFT knee, and is worse than previously.  It's still worse with kneeling and with standing after sitting for extended sitting. He's now interested in evaluation and treatment. His pain was worse this week, and he  even had to leave work early one day.  No numbness or tingling, no weakness. He feels a "crunching" sensation on the LEFT.  Review of Systems As above.    Objective:   Physical Exam  Constitutional: He is oriented to person, place, and time. He appears well-developed and well-nourished. He is active and cooperative. No distress.  BP 126/74  Pulse 76  Temp(Src) 97.7 F (36.5 C) (Oral)  Resp 16  Ht  (1.676 m)  Wt 182 lb (82.555 kg)  BMI 29.39 kg/m2  SpO2 99%   Eyes: Conjunctivae are normal.  Cardiovascular:  Significant varicosities both lower extremities.  Pulmonary/Chest: Effort normal.  Musculoskeletal:       Right knee: He exhibits normal range of motion, no swelling, no effusion, no ecchymosis, no deformity, no laceration, no erythema, normal alignment, no LCL laxity, normal patellar mobility, no bony tenderness, normal meniscus and no MCL laxity. No tenderness found.       Left knee: He exhibits normal range of motion, no swelling, no effusion, no ecchymosis, no deformity, no laceration, no erythema, normal alignment, no LCL laxity, normal patellar mobility, no bony tenderness, normal meniscus and no MCL laxity. No tenderness found.  Mild crepitus both knees, L>R.  Neurological: He is alert and oriented to person, place, and time.  Psychiatric: He has a normal mood and affect. His speech is  normal and behavior is normal.    BILATERAL KNEES: [facility] reading (PRIMARY) by  Dr. Patsy Lager.  Degenerative changes both knees, L>R. Only injury to LEFT tibia.        Assessment & Plan:  1. Knee pain, bilateral 2. Osteoarthritis of both knees, unspecified osteoarthritis type Trial of meloxicam, take 1 daily x 1-2 weeks, then reduce to prn.  If pain persists, plan referral to orthopedics. - DG Knee Complete 4 Views Left; Future - DG Knee Complete 4 Views Right; Future - meloxicam (MOBIC) 15 MG tablet; Take 1 tablet (15 mg total) by mouth daily.  Dispense: 30 tablet; Refill:  0    Fernande Bras, PA-C Physician Assistant-Certified [facility]

## 2014-04-21 NOTE — Patient Instructions (Signed)
Take the Meloxicam (Mobic) ONE TIME each day for 1-2 weeks, then use one time each day only if you have knee pain.

## 2014-05-14 ENCOUNTER — Ambulatory Visit (INDEPENDENT_AMBULATORY_CARE_PROVIDER_SITE_OTHER): Payer: 59 | Admitting: Physician Assistant

## 2014-05-14 ENCOUNTER — Other Ambulatory Visit: Payer: Self-pay | Admitting: Physician Assistant

## 2014-05-14 VITALS — BP 138/88 | HR 86 | Temp 97.6°F | Resp 18 | Ht 66.5 in | Wt 190.8 lb

## 2014-05-14 DIAGNOSIS — L309 Dermatitis, unspecified: Secondary | ICD-10-CM

## 2014-05-14 MED ORDER — VALACYCLOVIR HCL 1 G PO TABS: 1000.0000 mg | ORAL_TABLET | Freq: Three times a day (TID) | ORAL | Status: DC

## 2014-05-14 NOTE — Patient Instructions (Signed)
Stop the creams (Iodex and VapoRub). RESTART all your other medications. Use the medication I gave you as needed for knee pain.

## 2014-05-14 NOTE — Progress Notes (Signed)
   Subjective:    Patient ID: Estill DoomsMohamed Hemm, male    DOB: 08/11/1941, 73 y.o.   MRN: [id number]   PCP: Loraina Stauffer, PA-C  Chief Complaint  Patient presents with  . Knee Pain    states he has "bubbles" in left knee x 4 days     Medications, allergies, past medical history, surgical history, family history, social history and problem list reviewed and updated.  HPI  This 73 y.o. male presents for evaluation of a rash on the LEFT leg and low back where he applied several topical products recently.  He has degenerative joint disease of both knees.  I had prescribed meloxicam for PRN use, which works well for him, but some friends of his suggested several topical products instead.  "I feel so stupid." About a week ago, he tried Iodex and then a few days later added Vicks VapoRub. Four days ago he noticed "bubbles" erupting on the skin where the products were applied.  No pain or itching, though he notes the sensation that a bug is crawling on him in the areas where the lesions are.  No fever, chills.  He stopped all his medications, concerned that the lesions may have been caused by an interaction of some sort. No history of similar lesions previously.  Review of Systems As above.    Objective:   Physical Exam  Constitutional: He is oriented to person, place, and time. He appears well-developed and well-nourished. He is active and cooperative. No distress.  BP 138/88  Pulse 86  Temp(Src) 97.6 F (36.4 C) (Oral)  Resp 18  Ht 5' 6.5" (1.689 m)  Wt 190 lb 12.8 oz (86.546 kg)  BMI 30.34 kg/m2  SpO2 96%   Eyes: Conjunctivae are normal.  Pulmonary/Chest: Effort normal.  Neurological: He is alert and oriented to person, place, and time.  Skin: Rash noted. Rash is vesicular (non-erythematous based lesions).     Psychiatric: He has a normal mood and affect. His speech is normal and behavior is normal.          Assessment & Plan:  1. Dermatitis While this certainly could be  contact dermatitis given the use of topical products in the areas where the lesions erupted, this has the appearance of varicella zoster (following the L2 and L3 dermatomes), despite lack of associated pain. Supportive care, anticipatory guidance. Advised to stop the offending topical products. Restart his regular medications. - valACYclovir (VALTREX) 1000 MG tablet; Take 1 tablet (1,000 mg total) by mouth 3 (three) times daily.  Dispense: 30 tablet; Refill: 0   Fernande Brashelle S. Xavious Sharrar, PA-C Physician Assistant-Certified [facility]

## 2014-05-26 ENCOUNTER — Telehealth: Payer: Self-pay | Admitting: *Deleted

## 2014-05-26 NOTE — Telephone Encounter (Signed)
Called patient to advise to follow up with [name] on his diabetes come to the walk in clinic. Per patient he will come on Friday I advised him to check to see if [name] will be there, but any provider can see him to check his diabetes.

## 2014-06-09 ENCOUNTER — Ambulatory Visit (INDEPENDENT_AMBULATORY_CARE_PROVIDER_SITE_OTHER): Payer: 59 | Admitting: Physician Assistant

## 2014-06-09 VITALS — BP 110/80 | HR 83 | Temp 98.5°F | Resp 16 | Ht 66.0 in | Wt 190.0 lb

## 2014-06-09 DIAGNOSIS — Z23 Encounter for immunization: Secondary | ICD-10-CM

## 2014-06-09 DIAGNOSIS — E119 Type 2 diabetes mellitus without complications: Secondary | ICD-10-CM

## 2014-06-09 DIAGNOSIS — M25561 Pain in right knee: Secondary | ICD-10-CM

## 2014-06-09 DIAGNOSIS — Z029 Encounter for administrative examinations, unspecified: Secondary | ICD-10-CM

## 2014-06-09 DIAGNOSIS — I10 Essential (primary) hypertension: Secondary | ICD-10-CM

## 2014-06-09 DIAGNOSIS — E785 Hyperlipidemia, unspecified: Secondary | ICD-10-CM

## 2014-06-09 DIAGNOSIS — M25562 Pain in left knee: Secondary | ICD-10-CM

## 2014-06-09 LAB — POCT CBC
GRANULOCYTE PERCENT: 44.9 % (ref 37–80)
HCT, POC: 45.7 % (ref 43.5–53.7)
Hemoglobin: 14.8 g/dL (ref 14.1–18.1)
Lymph, poc: 2.3 (ref 0.6–3.4)
MCH, POC: 31.2 pg (ref 27–31.2)
MCHC: 32.4 g/dL (ref 31.8–35.4)
MCV: 96.2 fL (ref 80–97)
MID (cbc): 0.4 (ref 0–0.9)
MPV: 7.3 fL (ref 0–99.8)
PLATELET COUNT, POC: 269 10*3/uL (ref 142–424)
POC GRANULOCYTE: 2.2 (ref 2–6.9)
POC LYMPH %: 47.5 % (ref 10–50)
POC MID %: 7.6 %M (ref 0–12)
RBC: 4.75 M/uL (ref 4.69–6.13)
RDW, POC: 14 %
WBC: 4.8 10*3/uL (ref 4.6–10.2)

## 2014-06-09 LAB — GLUCOSE, POCT (MANUAL RESULT ENTRY): POC GLUCOSE: 83 mg/dL (ref 70–99)

## 2014-06-09 LAB — POCT GLYCOSYLATED HEMOGLOBIN (HGB A1C): Hemoglobin A1C: 6.7

## 2014-06-09 MED ORDER — MELOXICAM 15 MG PO TABS: 15.0000 mg | ORAL_TABLET | Freq: Every day | ORAL | Status: DC

## 2014-06-09 NOTE — Progress Notes (Signed)
Subjective:    Patient ID: Jerry Zimmerman, male    DOB: 12/12/1940, 73 y.o.   MRN: [id number]   PCP: Tameia Rafferty, PA-C  Chief Complaint  Patient presents with  . Knee Pain    Left worse than right    Medications, allergies, past medical history, surgical history, family history, social history and problem list reviewed and updated.  HPI  This 73 y.o. male presents for evaluation of bilateral knee pain.  Xrays last month revealed only minimal DJD. He had some relief from the meloxicam I prescribed, but then he forgot which bottle it was.  Today he brings in a bottle containing 2 leftover Amoxicillin tablets and another containing leftover Benzonatate, both from an illness in August, asking me which one is for his knee pain.  He notes that the knee pain essentially resolves when he's off work for several days in a row.  Since he works 12 hour shifts, during which he primarily walks and stands, he's asking for several weeks out of work to allow him to rest and see a specialist.  He reports pain and "shrinking" of the hamstring and calf muscles when he stands after sitting for break at work. He also reports a crunching sound with ROM of the knees.  The "bubbles" on the LEFT thigh and back have resolved-still not clear if that was shingles or a contact dermatitis.  I note he is due for DM follow-up as well.  Frequency of home glucose monitoring: doesn't check Doesn't see a dentist Q6 months, nor eye specialist annually. Checks feet daily. Is current with not influenza vaccine. "I do not get those." Is current with Pneumovax, but needs Prevnar-13 vaccine.   Review of Systems As above. Denies chest pain, shortness of breath, HA, dizziness, vision change, nausea, vomiting, diarrhea, constipation, melena, hematochezia, dysuria, increased urinary urgency or frequency, increased hunger or thirst, unintentional weight change, other unexplained myalgias or arthralgias, rash.     Objective:     Physical Exam  Constitutional: He is oriented to person, place, and time. He appears well-developed and well-nourished. He is active and cooperative. No distress.  BP 110/80  Pulse 83  Temp(Src) 98.5 F (36.9 C) (Oral)  Resp 16  Ht 5\' 6"  (1.676 m)  Wt 190 lb (86.183 kg)  BMI 30.68 kg/m2  SpO2 100%   Eyes: Conjunctivae are normal.  Pulmonary/Chest: Effort normal.  Musculoskeletal:       Right knee: He exhibits normal range of motion (with mild crepitus), no swelling, no effusion, no deformity, no erythema, normal alignment, no LCL laxity, normal patellar mobility, no bony tenderness, normal meniscus and no MCL laxity. No tenderness found.       Left knee: He exhibits normal range of motion (with mild crepitus), no swelling, no effusion, no deformity, no erythema, normal alignment, no LCL laxity, normal patellar mobility, no bony tenderness, normal meniscus and no MCL laxity. No tenderness found.  Neurological: He is alert and oriented to person, place, and time. He has normal strength.  Skin: Skin is warm and dry.  Psychiatric: He has a normal mood and affect. His speech is normal and behavior is normal.      Results for orders placed in visit on 06/09/14  POCT CBC      Result Value Ref Range   WBC 4.8  4.6 - 10.2 K/uL   Lymph, poc 2.3  0.6 - 3.4   POC LYMPH PERCENT 47.5  10 - 50 %L   MID (cbc) 0.4  0 - 0.9   POC MID % 7.6  0 - 12 %M   POC Granulocyte 2.2  2 - 6.9   Granulocyte percent 44.9  37 - 80 %G   RBC 4.75  4.69 - 6.13 M/uL   Hemoglobin 14.8  14.1 - 18.1 g/dL   HCT, POC 16.145.7  09.643.5 - 53.7 %   MCV 96.2  80 - 97 fL   MCH, POC 31.2  27 - 31.2 pg   MCHC 32.4  31.8 - 35.4 g/dL   RDW, POC 04.514.0     Platelet Count, POC 269  142 - 424 K/uL   MPV 7.3  0 - 99.8 fL  GLUCOSE, POCT (MANUAL RESULT ENTRY)      Result Value Ref Range   POC Glucose 83  70 - 99 mg/dl  POCT GLYCOSYLATED HEMOGLOBIN (HGB A1C)      Result Value Ref Range   Hemoglobin A1C 6.7         Assessment &  Plan:  1. Type 2 diabetes mellitus without complication Controlled. - POCT glucose (manual entry) - POCT glycosylated hemoglobin (Hb A1C) - Comprehensive metabolic panel  2. Hyperlipidemia Await labs. - Lipid panel  3. Essential hypertension, benign Controlled. - POCT CBC  4. Need for vaccination with 13-polyvalent pneumococcal conjugate vaccine - Pneumococcal conjugate vaccine 13-valent IM  5. Knee pain, bilateral - Ambulatory referral to Orthopedic Surgery - meloxicam (MOBIC) 15 MG tablet; Take 1 tablet (15 mg total) by mouth daily.  Dispense: 30 tablet; Refill: 0  Return in about 3 months (around 09/09/2014).  Fernande Brashelle S. Amery Minasyan, PA-C Physician Assistant-Certified [facility]

## 2014-06-09 NOTE — Patient Instructions (Signed)
Take the MELOXICAM for knee pain. I sent a refill to the pharmacy. If you have not heard anything regarding the referral in 1 week, please contact our office.  I will contact you with your lab results as soon as they are available.   If you have not heard from me in 2 weeks, please contact me.  The fastest way to get your results is to register for My Chart (see the instructions on the last page of this printout).

## 2014-06-10 ENCOUNTER — Encounter: Payer: Self-pay | Admitting: Physician Assistant

## 2014-06-10 LAB — LIPID PANEL
CHOLESTEROL: 203 mg/dL — AB (ref 0–200)
HDL: 31 mg/dL — ABNORMAL LOW (ref >39)
TRIGLYCERIDES: 419 mg/dL — AB (ref <150)
Total CHOL/HDL Ratio: 6.5 Ratio

## 2014-06-10 LAB — COMPREHENSIVE METABOLIC PANEL
ALBUMIN: 4.1 g/dL (ref 3.5–5.2)
ALT: 16 U/L (ref 0–53)
AST: 21 U/L (ref 0–37)
Alkaline Phosphatase: 63 U/L (ref 39–117)
BUN: 11 mg/dL (ref 6–23)
CALCIUM: 8.8 mg/dL (ref 8.4–10.5)
CHLORIDE: 102 meq/L (ref 96–112)
CO2: 23 meq/L (ref 19–32)
Creat: 0.83 mg/dL (ref 0.50–1.35)
Glucose, Bld: 100 mg/dL — ABNORMAL HIGH (ref 70–99)
POTASSIUM: 3.8 meq/L (ref 3.5–5.3)
Sodium: 136 mEq/L (ref 135–145)
Total Bilirubin: 0.5 mg/dL (ref 0.2–1.2)
Total Protein: 7.7 g/dL (ref 6.0–8.3)

## 2014-06-28 LAB — HM DIABETES EYE EXAM

## 2014-07-09 ENCOUNTER — Encounter: Payer: Self-pay | Admitting: Physician Assistant

## 2014-07-09 DIAGNOSIS — E113299 Type 2 diabetes mellitus with mild nonproliferative diabetic retinopathy without macular edema, unspecified eye: Secondary | ICD-10-CM

## 2014-07-09 DIAGNOSIS — E11319 Type 2 diabetes mellitus with unspecified diabetic retinopathy without macular edema: Secondary | ICD-10-CM | POA: Insufficient documentation

## 2014-07-19 ENCOUNTER — Ambulatory Visit (INDEPENDENT_AMBULATORY_CARE_PROVIDER_SITE_OTHER): Payer: 59 | Admitting: Physician Assistant

## 2014-07-19 VITALS — BP 132/86 | HR 87 | Temp 98.0°F | Resp 16 | Ht 68.0 in | Wt 192.0 lb

## 2014-07-19 DIAGNOSIS — I10 Essential (primary) hypertension: Secondary | ICD-10-CM

## 2014-07-19 DIAGNOSIS — M25561 Pain in right knee: Secondary | ICD-10-CM

## 2014-07-19 DIAGNOSIS — M25562 Pain in left knee: Secondary | ICD-10-CM

## 2014-07-19 DIAGNOSIS — E119 Type 2 diabetes mellitus without complications: Secondary | ICD-10-CM

## 2014-07-19 DIAGNOSIS — E785 Hyperlipidemia, unspecified: Secondary | ICD-10-CM

## 2014-07-19 MED ORDER — METFORMIN HCL 500 MG PO TABS: ORAL_TABLET | ORAL | Status: DC

## 2014-07-19 MED ORDER — GEMFIBROZIL 600 MG PO TABS: ORAL_TABLET | ORAL | Status: DC

## 2014-07-19 MED ORDER — MELOXICAM 15 MG PO TABS: 15.0000 mg | ORAL_TABLET | Freq: Every day | ORAL | Status: DC

## 2014-07-19 MED ORDER — LISINOPRIL 5 MG PO TABS: 5.0000 mg | ORAL_TABLET | Freq: Every day | ORAL | Status: DC

## 2014-07-19 NOTE — Progress Notes (Signed)
Subjective:    Patient ID: Jerry Zimmerman, male    DOB: [birth year], 73 y.o.   MRN: [id number]   PCP: Jerry Kreiter, PA-C  Chief Complaint  Patient presents with  . Follow-up  . Diabetes  . Blood Pressure Check  . Hyperlipidemia    No Known Allergies  Patient Active Problem List   Diagnosis Date Noted  . Diabetic retinopathy 07/09/2014  . 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 Essential hypertension, benign   . Obesity   . SLEEP APNEA 05/17/2009  . DM (diabetes mellitus), type 2 with ophthalmic complications 05/16/2009  . Hyperlipidemia 05/16/2009    Prior to Admission medications   Medication Sig Start Date End Date Taking? Authorizing Provider  lisinopril (PRINIVIL,ZESTRIL) 5 MG tablet Take 1 tablet (5 mg total) by mouth daily. 07/19/14  Yes Jerry Stthomas S Dora Clauss, PA-C  meloxicam (MOBIC) 15 MG tablet Take 1 tablet (15 mg total) by mouth daily. 07/19/14  Yes Jerry Esquivias S Afsa Meany, PA-C  metFORMIN (GLUCOPHAGE) 500 MG tablet TAKE ONE TABLET BY MOUTH ONCE DAILY WITH BREAKFAST 07/19/14  Yes Jerry Dingledine S Marcee Jacobs, PA-C  aspirin 81 MG tablet Take 81 mg by mouth daily.    Historical Provider, MD  gemfibrozil (LOPID) 600 MG tablet TAKE ONE TABLET BY MOUTH TWICE DAILY BEFORE MEAL(S) 07/19/14   Jerry Woodrome S Dyanara Cozza, PA-C  ipratropium (ATROVENT) 0.03 % nasal spray Place 2 sprays into both nostrils 2 (two) times daily. Patient not taking: Reported on 07/19/2014 02/24/14   Jerry Bras, PA-C    Medical, Surgical, Family and Social History reviewed and updated.  HPI  Jerry Zimmerman presents wanting to update his labs and prescriptions.  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  Some are retiring, some are resigning and some are being let go.  It's no longer a place where people love to work.  He's planning to leave as well, he's been there about 11 years, and wants to get everything "in order" before he loses his insurance.  He has not considered  applying for Medicare.  He's been advised he needs cataract repair.  He thinks he'll do that while he still has insurance.  His knee pain is well controlled with meloxicam.  I reviewed his last visit, including lab results, 06/09/2014.  Recent Results (from the past 2160 hour(s))  Comprehensive metabolic panel     Status: Abnormal   Collection Time: 06/09/14 12:38 PM  Result Value Ref Range   Sodium 136 135 - 145 mEq/L   Potassium 3.8 3.5 - 5.3 mEq/L   Chloride 102 96 - 112 mEq/L   CO2 23 19 - 32 mEq/L   Glucose, Bld 100 (H) 70 - 99 mg/dL   BUN 11 6 - 23 mg/dL   Creat 5.95 6.38 - 7.56 mg/dL   Total Bilirubin 0.5 0.2 - 1.2 mg/dL   Alkaline Phosphatase 63 39 - 117 U/L   AST 21 0 - 37 U/L   ALT 16 0 - 53 U/L   Total Protein 7.7 6.0 - 8.3 g/dL   Albumin 4.1 3.5 - 5.2 g/dL   Calcium 8.8 8.4 - 43.3 mg/dL  Lipid panel     Status: Abnormal   Collection Time: 06/09/14 12:38 PM  Result Value Ref Range   Cholesterol 203 (H) 0 - 200 mg/dL    Comment: ATP III Classification:       < 200        mg/dL  Desirable      200 - 239     mg/dL        Borderline High      >= 240        mg/dL        High     Triglycerides 419 (H) <150 mg/dL   HDL 31 (L) >16>39 mg/dL   Total CHOL/HDL Ratio 6.5 Ratio   VLDL NOT CALC 0 - 40 mg/dL    Comment:   Not calculated due to Triglyceride >400. Suggest ordering Direct LDL (Unit Code: 1096081033).   LDL Cholesterol NOT CALC 0 - 99 mg/dL    Comment:   Not calculated due to Triglyceride >400. Suggest ordering Direct LDL (Unit Code: 4540981033).   Total Cholesterol/HDL Ratio:CHD Risk                        Coronary Heart Disease Risk Table                                        Men       Women          1/2 Average Risk              3.4        3.3              Average Risk              5.0        4.4           2X Average Risk              9.6        7.1           3X Average Risk             23.4       11.0 Use the calculated Patient Ratio above and the CHD Risk  table  to determine the patient's CHD Risk. ATP III Classification (LDL):       < 100        mg/dL         Optimal      811100 - 129     mg/dL         Near or Above Optimal      130 - 159     mg/dL         Borderline High      160 - 189     mg/dL         High       > 914190        mg/dL         Very High    POCT CBC     Status: None   Collection Time: 06/09/14 12:43 PM  Result Value Ref Range   WBC 4.8 4.6 - 10.2 K/uL   Lymph, poc 2.3 0.6 - 3.4   POC LYMPH PERCENT 47.5 10 - 50 %L   MID (cbc) 0.4 0 - 0.9   POC MID % 7.6 0 - 12 %M   POC Granulocyte 2.2 2 - 6.9   Granulocyte percent 44.9 37 - 80 %G   RBC 4.75 4.69 - 6.13 M/uL   Hemoglobin 14.8 14.1 - 18.1 g/dL   HCT, POC 78.245.7 95.643.5 - 53.7 %  MCV 96.2 80 - 97 fL   MCH, POC 31.2 27 - 31.2 pg   MCHC 32.4 31.8 - 35.4 g/dL   RDW, POC 16.1 %   Platelet Count, POC 269 142 - 424 K/uL   MPV 7.3 0 - 99.8 fL  POCT glucose (manual entry)     Status: None   Collection Time: 06/09/14 12:43 PM  Result Value Ref Range   POC Glucose 83 70 - 99 mg/dl  POCT glycosylated hemoglobin (Hb A1C)     Status: None   Collection Time: 06/09/14 12:43 PM  Result Value Ref Range   Hemoglobin A1C 6.7   HM DIABETES EYE EXAM     Status: Abnormal   Collection Time: 06/28/14 12:00 AM  Result Value Ref Range   HM Diabetic Eye Exam Retinopathy (A) No Retinopathy    Comment: background retinopathy, mild, non-proliferative RIGHT eye     Review of Systems Denies chest pain, shortness of breath, HA, dizziness, vision change, nausea, vomiting, diarrhea, constipation, melena, hematochezia, dysuria, increased urinary urgency or frequency, increased hunger or thirst, unintentional weight change, unexplained myalgias or arthralgias, rash.     Objective:   Physical Exam  Constitutional: He is oriented to person, place, and time. Vital signs are normal. He appears well-developed and well-nourished. He is active and cooperative. No distress.  BP 132/86 mmHg  Pulse 87   Temp(Src) 98 F (36.7 C) (Oral)  Resp 16  Ht 5\' 8"  (1.727 m)  Wt 192 lb (87.091 kg)  BMI 29.20 kg/m2  SpO2 98%  HENT:  Head: Normocephalic and atraumatic.  Right Ear: Hearing normal.  Left Ear: Hearing normal.  Eyes: Conjunctivae are normal. No scleral icterus.  Neck: Normal range of motion. Neck supple. No thyromegaly present.  Cardiovascular: Normal rate, regular rhythm and normal heart sounds.   Pulses:      Radial pulses are 2+ on the right side, and 2+ on the left side.  Pulmonary/Chest: Effort normal and breath sounds normal.  Lymphadenopathy:       Head (right side): No tonsillar, no preauricular, no posterior auricular and no occipital adenopathy present.       Head (left side): No tonsillar, no preauricular, no posterior auricular and no occipital adenopathy present.    He has no cervical adenopathy.       Right: No supraclavicular adenopathy present.       Left: No supraclavicular adenopathy present.  Neurological: He is alert and oriented to person, place, and time. No sensory deficit.  Skin: Skin is warm, dry and intact. No rash noted. No cyanosis or erythema. Nails show no clubbing.  Psychiatric: He has a normal mood and affect.  Vitals reviewed.   He is not fasting, and it has been less than 8 weeks since his last labs were drawn.  We elect to hold off.      Assessment & Plan:  1. Type 2 diabetes mellitus without complication Controlled. Continue metformin and healthy eating. - metFORMIN (GLUCOPHAGE) 500 MG tablet; TAKE ONE TABLET BY MOUTH ONCE DAILY WITH BREAKFAST  Dispense: 90 tablet; Refill: 3  2. Hyperlipidemia At last visit, he wasn't taking gemfibrozil. Reminded he needs to take this regularly and make healthy eating choices. - gemfibrozil (LOPID) 600 MG tablet; TAKE ONE TABLET BY MOUTH TWICE DAILY BEFORE MEAL(S)  Dispense: 180 tablet; Refill: 3  3. Essential hypertension, benign Controlled. Continue lisinopril 5 mg daily. - lisinopril  (PRINIVIL,ZESTRIL) 5 MG tablet; Take 1 tablet (5 mg total) by mouth daily.  Dispense: 90 tablet; Refill: 3  4. Knee pain, bilateral Controlled with meloxicam. - meloxicam (MOBIC) 15 MG tablet; Take 1 tablet (15 mg total) by mouth daily.  Dispense: 90 tablet; Refill: 3  Return in about 2 months (around 09/19/2014).  Jerry Brashelle S. Tayden Duran, PA-C Physician Assistant-Certified [facility]

## 2014-07-19 NOTE — Patient Instructions (Signed)
If you leave your job, apply for Medicare. Continue taking all your medications.  Let's plan to see each other in about 2 months, and get blood then (as long as you have either health insurance or Medicare).

## 2014-07-20 ENCOUNTER — Telehealth: Payer: Self-pay | Admitting: Family Medicine

## 2014-07-20 NOTE — Telephone Encounter (Signed)
Left a message for patient to come in and get his flu shot.

## 2014-08-03 ENCOUNTER — Telehealth: Payer: Self-pay | Admitting: *Deleted

## 2014-08-03 NOTE — Telephone Encounter (Signed)
Per person who answered the phone, who did not give his name, stated this is the not the telephone number for the patient.

## 2014-11-05 ENCOUNTER — Ambulatory Visit (INDEPENDENT_AMBULATORY_CARE_PROVIDER_SITE_OTHER): Payer: 59 | Admitting: Physician Assistant

## 2014-11-05 VITALS — BP 150/84 | HR 98 | Temp 98.1°F | Resp 20 | Ht 68.0 in | Wt 189.8 lb

## 2014-11-05 DIAGNOSIS — J069 Acute upper respiratory infection, unspecified: Secondary | ICD-10-CM

## 2014-11-05 DIAGNOSIS — I10 Essential (primary) hypertension: Secondary | ICD-10-CM

## 2014-11-05 DIAGNOSIS — E785 Hyperlipidemia, unspecified: Secondary | ICD-10-CM | POA: Diagnosis not present

## 2014-11-05 DIAGNOSIS — E119 Type 2 diabetes mellitus without complications: Secondary | ICD-10-CM | POA: Diagnosis not present

## 2014-11-05 DIAGNOSIS — E113299 Type 2 diabetes mellitus with mild nonproliferative diabetic retinopathy without macular edema, unspecified eye: Secondary | ICD-10-CM

## 2014-11-05 DIAGNOSIS — E11329 Type 2 diabetes mellitus with mild nonproliferative diabetic retinopathy without macular edema: Secondary | ICD-10-CM

## 2014-11-05 LAB — GLUCOSE, POCT (MANUAL RESULT ENTRY): POC Glucose: 130 mg/dl — AB (ref 70–99)

## 2014-11-05 LAB — COMPLETE METABOLIC PANEL WITH GFR
ALT: 14 U/L (ref 0–53)
AST: 17 U/L (ref 0–37)
Albumin: 3.7 g/dL (ref 3.5–5.2)
Alkaline Phosphatase: 63 U/L (ref 39–117)
BUN: 12 mg/dL (ref 6–23)
CO2: 23 meq/L (ref 19–32)
Calcium: 8.9 mg/dL (ref 8.4–10.5)
Chloride: 100 mEq/L (ref 96–112)
Creat: 0.82 mg/dL (ref 0.50–1.35)
GFR, Est Non African American: 87 mL/min
Glucose, Bld: 124 mg/dL — ABNORMAL HIGH (ref 70–99)
POTASSIUM: 4 meq/L (ref 3.5–5.3)
SODIUM: 133 meq/L — AB (ref 135–145)
TOTAL PROTEIN: 7.1 g/dL (ref 6.0–8.3)
Total Bilirubin: 0.9 mg/dL (ref 0.2–1.2)

## 2014-11-05 LAB — POCT GLYCOSYLATED HEMOGLOBIN (HGB A1C): Hemoglobin A1C: 6.8

## 2014-11-05 LAB — POCT CBC
GRANULOCYTE PERCENT: 69.6 % (ref 37–80)
HCT, POC: 41.1 % — AB (ref 43.5–53.7)
Hemoglobin: 13.3 g/dL — AB (ref 14.1–18.1)
Lymph, poc: 1.4 (ref 0.6–3.4)
MCH, POC: 30.5 pg (ref 27–31.2)
MCHC: 32.4 g/dL (ref 31.8–35.4)
MCV: 97.2 fL — AB (ref 80–97)
MID (CBC): 0.6 (ref 0–0.9)
MPV: 7.1 fL (ref 0–99.8)
PLATELET COUNT, POC: 265 10*3/uL (ref 142–424)
POC Granulocyte: 4.4 (ref 2–6.9)
POC LYMPH %: 21.6 % (ref 10–50)
POC MID %: 8.8 %M (ref 0–12)
RBC: 4.36 M/uL — AB (ref 4.69–6.13)
RDW, POC: 12.7 %
WBC: 6.3 10*3/uL (ref 4.6–10.2)

## 2014-11-05 LAB — LIPID PANEL
CHOL/HDL RATIO: 6.4 ratio
CHOLESTEROL: 205 mg/dL — AB (ref 0–200)
HDL: 32 mg/dL — ABNORMAL LOW (ref >=40)
LDL Cholesterol: 108 mg/dL — ABNORMAL HIGH (ref 0–99)
Triglycerides: 324 mg/dL — ABNORMAL HIGH (ref <150)
VLDL: 65 mg/dL — ABNORMAL HIGH (ref 0–40)

## 2014-11-05 MED ORDER — GUAIFENESIN ER 1200 MG PO TB12: 1.0000 | ORAL_TABLET | Freq: Two times a day (BID) | ORAL | Status: AC

## 2014-11-05 MED ORDER — GEMFIBROZIL 600 MG PO TABS: ORAL_TABLET | ORAL | Status: DC

## 2014-11-05 MED ORDER — LISINOPRIL 5 MG PO TABS: 5.0000 mg | ORAL_TABLET | Freq: Every day | ORAL | Status: DC

## 2014-11-05 MED ORDER — METFORMIN HCL 500 MG PO TABS: ORAL_TABLET | ORAL | Status: DC

## 2014-11-05 NOTE — Patient Instructions (Signed)
I will contact you with your lab results as soon as they are available.   If you have not heard from me in 2 weeks, please contact me.  The fastest way to get your results is to register for My Chart (see the instructions on the last page of this printout).   

## 2014-11-05 NOTE — Progress Notes (Signed)
Subjective:    Patient ID: Jerry Zimmerman, male    DOB: February 25, 1941, 74 y.o.   MRN: [id number]  HPI Pt presents to clinic with cold symptoms and feeling poorly over the last week and then today with increased body aches and headaches.  He also needs a recheck of his diabetes.  He has not been taking his lisinopril and he has been taking Nyquil for cold symptoms over the last 2 days. He does not check his BP at home.  He has noticed that over the last week his normal myalgias are worse and he does not think that he can work today because he feel so bad.  Over about the last 3 weeks he has started to develop burning sensation on the bottom of both of his feet.  He has no lesions that he knows of on his feet.  Review of Systems  Constitutional: Positive for fever (subjective).  HENT: Positive for congestion, sneezing and sore throat.   Musculoskeletal: Positive for myalgias.  Allergic/Immunologic: Negative for environmental allergies.  Neurological: Positive for headaches.   Patient Active Problem List   Diagnosis Date Noted  . Diabetic retinopathy 07/09/2014  . 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 Essential hypertension, benign   . Obesity   . SLEEP APNEA 05/17/2009  . DM (diabetes mellitus), type 2 with ophthalmic complications 05/16/2009  . Hyperlipidemia 05/16/2009   Prior to Admission medications   Medication Sig Start Date End Date Taking? Authorizing Provider  lisinopril (PRINIVIL,ZESTRIL) 5 MG tablet Take 1 tablet (5 mg total) by mouth daily. 07/19/14  Yes [name] S Jeffery, PA-C  metFORMIN (GLUCOPHAGE) 500 MG tablet TAKE ONE TABLET BY MOUTH ONCE DAILY WITH BREAKFAST 07/19/14  Yes [name] S Jeffery, PA-C  aspirin 81 MG tablet Take 81 mg by mouth daily.    Historical Provider, MD  gemfibrozil (LOPID) 600 MG tablet TAKE ONE TABLET BY MOUTH TWICE DAILY BEFORE MEAL(S) Patient not taking: Reported on 11/05/2014 07/19/14   [name] S Jeffery, PA-C  ipratropium (ATROVENT) 0.03 %  nasal spray Place 2 sprays into both nostrils 2 (two) times daily. Patient not taking: Reported on 07/19/2014 02/24/14   [name] S Jeffery, PA-C  meloxicam (MOBIC) 15 MG tablet Take 1 tablet (15 mg total) by mouth daily. Patient not taking: Reported on 11/05/2014 07/19/14   Fernande Bras, PA-C   No Known Allergies  Medications, allergies, past medical history, surgical history, family history, social history and problem list reviewed and updated.      Objective:   Physical Exam  Constitutional: He is oriented to person, place, and time. He appears well-developed and well-nourished.  BP 150/84 mmHg  Pulse 98  Temp(Src) 98.1 F (36.7 C) (Oral)  Resp 20  Ht  (1.727 m)  Wt 189 lb 12.8 oz (86.093 kg)  BMI 28.87 kg/m2  SpO2 98%   HENT:  Head: Normocephalic and atraumatic.  Right Ear: Hearing, tympanic membrane, external ear and ear canal normal.  Left Ear: Hearing, tympanic membrane, external ear and ear canal normal.  Nose: Mucosal edema (red) present.  Mouth/Throat: Uvula is midline, oropharynx is clear and moist and mucous membranes are normal.  Eyes: Conjunctivae are normal.  Neck: Normal range of motion.  Cardiovascular: Normal rate, regular rhythm and normal heart sounds.   No murmur heard. Pulmonary/Chest: Effort normal and breath sounds normal. He has no wheezes.  Musculoskeletal:  Diabetic Foot Exam - Simple   Simple Foot Form  Diabetic Foot exam was performed with  the following findings:  Yes  11/05/2014  2:52 PM  Visual Inspection  No deformities, no ulcerations, no other skin breakdown bilaterally:  Yes  Sensation Testing  Intact to touch and monofilament testing bilaterally:  Yes  Pulse Check  Posterior Tibialis and Dorsalis pulse intact bilaterally:  Yes  Comments  Calluses on the bottom of bilateral feet   Lymphadenopathy:    He has no cervical adenopathy.  Neurological: He is alert and oriented to person, place, and time.  Skin: Skin is warm and dry.    Psychiatric: He has a normal mood and affect. His behavior is normal. Judgment and thought content normal.   Results for orders placed or performed in visit on 11/05/14  POCT glycosylated hemoglobin (Hb A1C)  Result Value Ref Range   Hemoglobin A1C 6.8   POCT glucose (manual entry)  Result Value Ref Range   POC Glucose 130 (A) 70 - 99 mg/dl  POCT CBC  Result Value Ref Range   WBC 6.3 4.6 - 10.2 K/uL   Lymph, poc 1.4 0.6 - 3.4   POC LYMPH PERCENT 21.6 10 - 50 %L   MID (cbc) 0.6 0 - 0.9   POC MID % 8.8 0 - 12 %M   POC Granulocyte 4.4 2 - 6.9   Granulocyte percent 69.6 37 - 80 %G   RBC 4.36 (A) 4.69 - 6.13 M/uL   Hemoglobin 13.3 (A) 14.1 - 18.1 g/dL   HCT, POC 40.941.1 (A) 81.143.5 - 53.7 %   MCV 97.2 (A) 80 - 97 fL   MCH, POC 30.5 27 - 31.2 pg   MCHC 32.4 31.8 - 35.4 g/dL   RDW, POC 91.412.7 %   Platelet Count, POC 265 142 - 424 K/uL   MPV 7.1 0 - 99.8 fL       Assessment & Plan:  Essential hypertension, benign - BP elevated today due to sudafed containing cold products - Plan: lisinopril (PRINIVIL,ZESTRIL) 5 MG tablet  Type 2 diabetes mellitus with mild nonproliferative diabetic retinopathy without macular edema - Plan: COMPLETE METABOLIC PANEL WITH GFR, POCT glycosylated hemoglobin (Hb A1C), POCT glucose (manual entry), HM DIABETES FOOT EXAM  Hyperlipidemia - Plan: Lipid panel, gemfibrozil (LOPID) 600 MG tablet  Viral URI - Plan: POCT CBC, Guaifenesin (MUCINEX MAXIMUM STRENGTH) 1200 MG TB12  Type 2 diabetes mellitus without complication - Plan: metFORMIN (GLUCOPHAGE) 500 MG tablet   Controlled Type 2 DM with ? Early diabetic peripheral neuropathy - he feels terrible today so we will wait and see if the burning in his feet continue.  We will wait for his labs to return to make changes to his cholesterol medication is needed.  He will recheck in 3 months.  We talked about the importance of daily ASA and his lisinopril and he understands the importance and plans to take his medications  daily.  Benny LennertSarah Weber PA-C  [facility] 11/05/2014 4:13 PM

## 2014-11-06 ENCOUNTER — Encounter: Payer: Self-pay | Admitting: Physician Assistant

## 2014-11-23 ENCOUNTER — Ambulatory Visit: Payer: 59 | Admitting: Physician Assistant

## 2014-11-30 ENCOUNTER — Ambulatory Visit: Payer: 59 | Admitting: Physician Assistant

## 2015-02-10 ENCOUNTER — Ambulatory Visit (INDEPENDENT_AMBULATORY_CARE_PROVIDER_SITE_OTHER): Payer: 59 | Admitting: Physician Assistant

## 2015-02-10 ENCOUNTER — Encounter: Payer: Self-pay | Admitting: Physician Assistant

## 2015-02-10 VITALS — BP 168/78 | HR 71 | Temp 98.1°F | Resp 16 | Ht 65.25 in | Wt 184.8 lb

## 2015-02-10 DIAGNOSIS — D649 Anemia, unspecified: Secondary | ICD-10-CM | POA: Diagnosis not present

## 2015-02-10 DIAGNOSIS — I10 Essential (primary) hypertension: Secondary | ICD-10-CM

## 2015-02-10 DIAGNOSIS — Z7189 Other specified counseling: Secondary | ICD-10-CM

## 2015-02-10 DIAGNOSIS — E113299 Type 2 diabetes mellitus with mild nonproliferative diabetic retinopathy without macular edema, unspecified eye: Secondary | ICD-10-CM

## 2015-02-10 DIAGNOSIS — Z0000 Encounter for general adult medical examination without abnormal findings: Secondary | ICD-10-CM

## 2015-02-10 DIAGNOSIS — E785 Hyperlipidemia, unspecified: Secondary | ICD-10-CM | POA: Diagnosis not present

## 2015-02-10 DIAGNOSIS — Z1211 Encounter for screening for malignant neoplasm of colon: Secondary | ICD-10-CM | POA: Diagnosis not present

## 2015-02-10 DIAGNOSIS — E11329 Type 2 diabetes mellitus with mild nonproliferative diabetic retinopathy without macular edema: Secondary | ICD-10-CM | POA: Diagnosis not present

## 2015-02-10 DIAGNOSIS — Z23 Encounter for immunization: Secondary | ICD-10-CM | POA: Diagnosis not present

## 2015-02-10 DIAGNOSIS — Z125 Encounter for screening for malignant neoplasm of prostate: Secondary | ICD-10-CM

## 2015-02-10 DIAGNOSIS — Z7184 Encounter for health counseling related to travel: Secondary | ICD-10-CM

## 2015-02-10 LAB — LIPID PANEL
CHOL/HDL RATIO: 8.5 ratio
CHOLESTEROL: 255 mg/dL — AB (ref 0–200)
HDL: 30 mg/dL — ABNORMAL LOW (ref >=40)
TRIGLYCERIDES: 497 mg/dL — AB (ref <150)

## 2015-02-10 LAB — COMPREHENSIVE METABOLIC PANEL
ALBUMIN: 3.7 g/dL (ref 3.5–5.2)
ALT: 11 U/L (ref 0–53)
AST: 18 U/L (ref 0–37)
Alkaline Phosphatase: 61 U/L (ref 39–117)
BUN: 12 mg/dL (ref 6–23)
CALCIUM: 8.8 mg/dL (ref 8.4–10.5)
CO2: 28 meq/L (ref 19–32)
CREATININE: 0.81 mg/dL (ref 0.50–1.35)
Chloride: 102 mEq/L (ref 96–112)
GLUCOSE: 123 mg/dL — AB (ref 70–99)
Potassium: 4.3 mEq/L (ref 3.5–5.3)
SODIUM: 137 meq/L (ref 135–145)
TOTAL PROTEIN: 7 g/dL (ref 6.0–8.3)
Total Bilirubin: 0.5 mg/dL (ref 0.2–1.2)

## 2015-02-10 LAB — CBC WITH DIFFERENTIAL/PLATELET
BASOS ABS: 0 10*3/uL (ref 0.0–0.1)
Basophils Relative: 1 % (ref 0–1)
EOS ABS: 0.3 10*3/uL (ref 0.0–0.7)
EOS PCT: 10 % — AB (ref 0–5)
HCT: 38.9 % — ABNORMAL LOW (ref 39.0–52.0)
HEMOGLOBIN: 13.3 g/dL (ref 13.0–17.0)
LYMPHS ABS: 1.6 10*3/uL (ref 0.7–4.0)
LYMPHS PCT: 48 % — AB (ref 12–46)
MCH: 31.1 pg (ref 26.0–34.0)
MCHC: 34.2 g/dL (ref 30.0–36.0)
MCV: 90.9 fL (ref 78.0–100.0)
MPV: 9.6 fL (ref 8.6–12.4)
Monocytes Absolute: 0.4 10*3/uL (ref 0.1–1.0)
Monocytes Relative: 13 % — ABNORMAL HIGH (ref 3–12)
NEUTROS ABS: 1 10*3/uL — AB (ref 1.7–7.7)
NEUTROS PCT: 28 % — AB (ref 43–77)
PLATELETS: 275 10*3/uL (ref 150–400)
RBC: 4.28 MIL/uL (ref 4.22–5.81)
RDW: 13.2 % (ref 11.5–15.5)
WBC: 3.4 10*3/uL — AB (ref 4.0–10.5)

## 2015-02-10 LAB — POCT GLYCOSYLATED HEMOGLOBIN (HGB A1C): Hemoglobin A1C: 7.3

## 2015-02-10 LAB — GLUCOSE, POCT (MANUAL RESULT ENTRY): POC Glucose: 125 mg/dl — AB (ref 70–99)

## 2015-02-10 MED ORDER — ZOSTER VACCINE LIVE 19400 UNT/0.65ML ~~LOC~~ SOLR
0.6500 mL | Freq: Once | SUBCUTANEOUS | Status: DC
Start: 2015-02-10 — End: 2015-07-27

## 2015-02-10 MED ORDER — DOXYCYCLINE HYCLATE 100 MG PO CAPS: 100.0000 mg | ORAL_CAPSULE | Freq: Every day | ORAL | Status: AC

## 2015-02-10 NOTE — Progress Notes (Signed)
Patient ID: Jerry Zimmerman, male    DOB: [birth year], 74 y.o.   MRN: [id number]  PCP: Navina Wohlers, PA-C  Chief Complaint  Patient presents with  . Annual Exam    per patient here for CPE, not OV    Subjective:   HPI: Presents today for follow-up of Diabetes, HTN and hyperlipidemia, and needs a CPE as well. He has a form from his employer that needs completion to document the annual preventive maintenance today.  Foot discomfort he reported in March is improved, but not resolved. Improved with getting up and moving around.  Will be traveling to [place] for his daughter's wedding and needs malaria prophylaxis. This is the height of the malaria season. He will be gone for 3-4 weeks.  Colonoscopy in 2014 was normal. He has not had Zostavax. Tdap is current. He saw an eye specialist 11/2014. Has not scheduled with a dentist, believing he doesn't need one since he has only a few teeth remaining (Just enough to chew, he says).  Patient Active Problem List   Diagnosis Date Noted  . Diabetic retinopathy 07/09/2014  . 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 Essential hypertension, benign   . Obesity   . SLEEP APNEA 05/17/2009  . DM (diabetes mellitus), type 2 with ophthalmic complications 05/16/2009  . Hyperlipidemia 05/16/2009    Past Medical History  Diagnosis Date  . Hyperlipidemia   . Hyperglycemia   . Essential hypertension, benign   . Obesity   . Diabetes mellitus without complication      Prior to Admission medications   Medication Sig Start Date End Date Taking? Authorizing Provider  aspirin 81 MG tablet Take 81 mg by mouth daily.   Yes Historical Provider, MD  gemfibrozil (LOPID) 600 MG tablet TAKE ONE TABLET BY MOUTH TWICE DAILY BEFORE MEAL(S) 11/05/14  Yes Morrell Riddle, PA-C  ipratropium (ATROVENT) 0.03 % nasal spray Place 2 sprays into both nostrils 2 (two) times daily. 02/24/14  Yes Adaijah Endres, PA-C  lisinopril (PRINIVIL,ZESTRIL) 5 MG tablet Take 1  tablet (5 mg total) by mouth daily. 11/05/14  Yes Morrell Riddle, PA-C  metFORMIN (GLUCOPHAGE) 500 MG tablet TAKE ONE TABLET BY MOUTH ONCE DAILY WITH BREAKFAST 11/05/14  Yes Morrell Riddle, PA-C  meloxicam (MOBIC) 15 MG tablet Take 1 tablet (15 mg total) by mouth daily. Patient not taking: Reported on 11/05/2014 07/19/14   Porfirio Oar, PA-C    No Known Allergies  Past Surgical History  Procedure Laterality Date  . Inguinal hernia repair      right  . Appendectomy    . Hemorrhoid surgery  1973    Family History  Problem Relation Age of Onset  . Colon cancer Neg Hx   . Hyperlipidemia Father     History   Social History  . Marital Status: Married    Spouse Name: N/A  . Number of Children: 4  . Years of Education: 14   Occupational History  . Quality Programmer, multimedia (pill maufacturing)   Social History Main Topics  . Smoking status: Former Smoker -- 3.00 packs/day for 18 years    Types: Cigarettes    Quit date: 08/13/1987  . Smokeless tobacco: Never Used  . Alcohol Use: No  . Drug Use: No  . Sexual Activity:    Partners: Female   Other Topics Concern  . None   Social History Narrative   Originally from [place], one of 12 siblings.   Advanced diploma  in Therapist, musicplastics and manufacturing in [place].   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   HENT: Negative.   Eyes: Negative.   Respiratory: Negative.   Cardiovascular: Negative.   Gastrointestinal: Negative.   Endocrine: Negative.   Genitourinary: Negative.   Musculoskeletal: Positive for arthralgias (LEFT knee pain with kneeling). Negative for myalgias, back pain, joint swelling, gait problem, neck pain and neck stiffness.  Skin: Negative.   Allergic/Immunologic: Negative.   Neurological: Negative for dizziness, weakness, light-headedness, numbness and headaches.       Burning pain in  feet has improved, but persists.  Hematological: Negative.   Psychiatric/Behavioral: Negative.         Objective:  Physical Exam  Constitutional: He is oriented to person, place, and time. Vital signs are normal. He appears well-developed and well-nourished. He is active and cooperative.  Non-toxic appearance. He does not have a sickly appearance. He does not appear ill. No distress.  HENT:  Head: Normocephalic and atraumatic.  Right Ear: Hearing, tympanic membrane, external ear and ear canal normal.  Left Ear: Hearing, tympanic membrane, external ear and ear canal normal.  Nose: Nose normal.  Mouth/Throat: Uvula is midline, oropharynx is clear and moist and mucous membranes are normal. He does not have dentures. No oral lesions. No trismus in the jaw. Abnormal dentition (missing many teeth). No dental abscesses, uvula swelling, lacerations or dental caries.  Eyes: Conjunctivae, EOM and lids are normal. Pupils are equal, round, and reactive to light. Right eye exhibits no discharge. Left eye exhibits no discharge. No scleral icterus.  Fundoscopic exam:      The right eye shows no arteriolar narrowing, no AV nicking, no exudate, no hemorrhage and no papilledema.       The left eye shows no arteriolar narrowing, no AV nicking, no exudate, no hemorrhage and no papilledema.  Neck: Normal range of motion, full passive range of motion without pain and phonation normal. Neck supple. No spinous process tenderness and no muscular tenderness present. No rigidity. No tracheal deviation, no edema, no erythema and normal range of motion present. No thyromegaly present.  Cardiovascular: Normal rate, regular rhythm, S1 normal, S2 normal, normal heart sounds, intact distal pulses and normal pulses.  Exam reveals no gallop and no friction rub.   No murmur heard. Pulmonary/Chest: Effort normal and breath sounds normal. No respiratory distress. He has no wheezes. He has no rales.  Abdominal: Soft. Normal  appearance and bowel sounds are normal. He exhibits no distension and no mass. There is no hepatosplenomegaly. There is no tenderness. There is no rebound and no guarding. No hernia. Hernia confirmed negative in the right inguinal area and confirmed negative in the left inguinal area.  Genitourinary: Rectum normal, prostate normal, testes normal and penis normal. Guaiac negative stool. Circumcised. No phimosis, paraphimosis, hypospadias, penile erythema or penile tenderness. No discharge found.  Musculoskeletal: Normal range of motion. He exhibits no edema or tenderness.       Right shoulder: Normal.       Left shoulder: Normal.       Right elbow: Normal.      Left elbow: Normal.       Right wrist: Normal.       Left wrist: Normal.       Right hip: Normal.  Left hip: Normal.       Right knee: Normal.       Left knee: Normal.       Right ankle: Normal. Achilles tendon normal.       Left ankle: Normal. Achilles tendon normal.       Cervical back: Normal. He exhibits normal range of motion, no tenderness, no bony tenderness, no swelling, no edema, no deformity, no laceration, no pain, no spasm and normal pulse.       Thoracic back: Normal.       Lumbar back: Normal.       Right upper arm: Normal.       Left upper arm: Normal.       Right forearm: Normal.       Left forearm: Normal.       Right hand: Normal.       Left hand: Normal.       Right upper leg: Normal.       Left upper leg: Normal.       Right lower leg: Normal.       Left lower leg: Normal.       Right foot: Normal.       Left foot: Normal.  Lymphadenopathy:       Head (right side): No submental, no submandibular, no tonsillar, no preauricular, no posterior auricular and no occipital adenopathy present.       Head (left side): No submental, no submandibular, no tonsillar, no preauricular, no posterior auricular and no occipital adenopathy present.    He has no cervical adenopathy.       Right: No inguinal and no  supraclavicular adenopathy present.       Left: No inguinal and no supraclavicular adenopathy present.  Neurological: He is alert and oriented to person, place, and time. He has normal strength and normal reflexes. He displays no tremor. No cranial nerve deficit. He exhibits normal muscle tone. Coordination and gait normal.  Skin: Skin is warm, dry and intact. No abrasion, no ecchymosis, no laceration, no lesion and no rash noted. He is not diaphoretic. No cyanosis or erythema. No pallor. Nails show no clubbing.  Psychiatric: He has a normal mood and affect. His speech is normal and behavior is normal. Judgment and thought content normal. Cognition and memory are normal.   Results for orders placed or performed in visit on 02/10/15  POCT glucose (manual entry)  Result Value Ref Range   POC Glucose 125 (A) 70 - 99 mg/dl  POCT glycosylated hemoglobin (Hb A1C)  Result Value Ref Range   Hemoglobin A1C 7.3    Diabetic Foot Exam - Simple   Simple Foot Form   02/10/2015  8:56 AM  Visual Inspection  No deformities, no ulcerations, no other skin breakdown bilaterally:  Yes  Sensation Testing  Intact to touch and monofilament testing bilaterally:  Yes  Pulse Check  Posterior Tibialis and Dorsalis pulse intact bilaterally:  Yes  Comments          Assessment & Plan:  1. Annual physical exam Age appropriate anticipatory guidance provided.  2. Type 2 diabetes mellitus with mild nonproliferative diabetic retinopathy without macular edema Controlled. Continue metformin. Encouraged dental visit, even for the few remaining teeth he has. - POCT glucose (manual entry) - POCT glycosylated hemoglobin (Hb A1C) - Comprehensive metabolic panel - Microalbumin, urine  3. Essential hypertension, benign Not controlled. Await labs. Will need additional BP lowering. Will determine agent with renal function results.  4. Hyperlipidemia  Await labs. He is inconsistent with gemfibrozil dosing. - Lipid  panel  5. Anemia, unspecified anemia type Await results. Reasuring that colonoscopy was normal in 2014, but check hemoccult. - CBC with Differential/Platelet  6. Travel advice encounter Malaria prophylaxis. - doxycycline (VIBRAMYCIN) 100 MG capsule; Take 1 capsule (100 mg total) by mouth daily. Begin 2-3 days before travel and continue for 4 weeks upon return.  Dispense: 60 capsule; Refill: 0  7. Screening for prostate cancer - PSA  8. Screening for colon cancer See above. - POC Hemoccult Bld/Stl (3-Cd Home Screen); Future  9. Need for shingles vaccine - zoster vaccine live, PF, (ZOSTAVAX) 16109 UNT/0.65ML injection; Inject 19,400 Units into the skin once.  Dispense: 0.65 mL; Refill: 0   Return in about 3 months (around 05/13/2015).   Fernande Bras, PA-C Physician Assistant-Certified [facility]

## 2015-02-10 NOTE — Patient Instructions (Signed)
I will contact you with your lab results as soon as they are available.   If you have not heard from me in 2 weeks, please contact me.  The fastest way to get your results is to register for My Chart (see the instructions on the last page of this printout).  Keeping you healthy  Get these tests  Blood pressure- Have your blood pressure checked once a year by your healthcare provider.  Normal blood pressure is 120/80  Weight- Have your body mass index (BMI) calculated to screen for obesity.  BMI is a measure of body fat based on height and weight. You can also calculate your own BMI at [email or web address]/[email or web address]/.  Cholesterol- Have your cholesterol checked every year.  Diabetes- Have your blood sugar checked regularly if you have high blood pressure, high cholesterol, have a family history of diabetes or if you are overweight.  Screening for Colon Cancer- Colonoscopy starting at age 50.  Screening may begin sooner depending on your family history and other health conditions. Follow up colonoscopy as directed by your Gastroenterologist.  Screening for Prostate Cancer- Both blood work (PSA) and a rectal exam help screen for Prostate Cancer.  Screening begins at age 40 with African-American men and at age 50 with Caucasian men.  Screening may begin sooner depending on your family history.  Take these medicines  Aspirin- One aspirin daily can help prevent Heart disease and Stroke.  Flu shot- Every fall.  Tetanus- Every 10 years.  Zostavax- Once after the age of 60 to prevent Shingles.  Pneumonia shot- Once after the age of 65; if you are younger than 65, ask your healthcare provider if you need a Pneumonia shot.  Take these steps  Don't smoke- If you do smoke, talk to your doctor about quitting.  For tips on how to quit, go to www.smokefree.gov or call 1-800-QUIT-NOW.  Be physically active- Exercise 5 days a week for at least 30 minutes.  If you are not already physically active start  slow and gradually work up to 30 minutes of moderate physical activity.  Examples of moderate activity include walking briskly, mowing the yard, dancing, swimming, bicycling, etc.  Eat a healthy diet- Eat a variety of healthy food such as fruits, vegetables, low fat milk, low fat cheese, yogurt, lean meant, poultry, fish, beans, tofu, etc. For more information go to www.thenutritionsource.org  Drink alcohol in moderation- Limit alcohol intake to less than two drinks a day. Never drink and drive.  Dentist- Brush and floss twice daily; visit your dentist twice a year.  Depression- Your emotional health is as important as your physical health. If you're feeling down, or losing interest in things you would normally enjoy please talk to your healthcare provider.  Eye exam- Visit your eye doctor every year.  Safe sex- If you may be exposed to a sexually transmitted infection, use a condom.  Seat belts- Seat belts can save your life; always wear one.  Smoke/Carbon Monoxide detectors- These detectors need to be installed on the appropriate level of your home.  Replace batteries at least once a year.  Skin cancer- When out in the sun, cover up and use sunscreen 15 SPF or higher.  Violence- If anyone is threatening you, please tell your healthcare provider.  Living Will/ Health care power of attorney- Speak with your healthcare provider and family. 

## 2015-02-11 LAB — PSA: PSA: 1.25 ng/mL (ref <=4.00)

## 2015-02-11 LAB — MICROALBUMIN, URINE: MICROALB UR: 0.6 mg/dL (ref <2.0)

## 2015-02-16 ENCOUNTER — Encounter: Payer: Self-pay | Admitting: Family Medicine

## 2015-02-24 ENCOUNTER — Ambulatory Visit: Payer: 59 | Admitting: Physician Assistant

## 2015-03-12 ENCOUNTER — Telehealth: Payer: Self-pay | Admitting: Family Medicine

## 2015-03-12 NOTE — Telephone Encounter (Signed)
Called both numbers both phones off calls wouldn't go through so i sent him a letter in mail with new appt date which is 06/21/15 at 9:30

## 2015-04-07 ENCOUNTER — Telehealth: Payer: Self-pay | Admitting: Physician Assistant

## 2015-04-07 NOTE — Telephone Encounter (Signed)
Patient request for [name] to complete a Biometric Measures & Physical Confirmation form. Patient stated he dropped the same form off 2 months ago and it was misplaced. Patient came to 104 numerous of times complaining about the form. Please Please make sure [name] receive the form. Patient request for [place] to call him once everything is complete. Patient request for [place] to fax the form to [phone]. Thank you. AJ I will place the form at 102 in the nurses's box. I also made a copy of the form for the patient.

## 2015-04-07 NOTE — Telephone Encounter (Signed)
Jerry Zimmerman filled out and signed form for pt on 02/10/2015. Form was scanned into pt's chart on 02/23/2015. Will print and fax prior form.  Attempted to call pt, left VM for pt to call back.

## 2015-04-11 NOTE — Telephone Encounter (Signed)
I will refax

## 2015-05-05 ENCOUNTER — Ambulatory Visit (INDEPENDENT_AMBULATORY_CARE_PROVIDER_SITE_OTHER): Payer: 59

## 2015-05-05 ENCOUNTER — Ambulatory Visit (INDEPENDENT_AMBULATORY_CARE_PROVIDER_SITE_OTHER): Payer: 59 | Admitting: Family Medicine

## 2015-05-05 ENCOUNTER — Encounter: Payer: Self-pay | Admitting: Physician Assistant

## 2015-05-05 ENCOUNTER — Telehealth: Payer: Self-pay

## 2015-05-05 VITALS — BP 140/80 | HR 75 | Temp 98.7°F | Resp 16 | Ht 66.25 in | Wt 188.2 lb

## 2015-05-05 DIAGNOSIS — Z23 Encounter for immunization: Secondary | ICD-10-CM

## 2015-05-05 DIAGNOSIS — K5900 Constipation, unspecified: Secondary | ICD-10-CM | POA: Diagnosis not present

## 2015-05-05 DIAGNOSIS — R1084 Generalized abdominal pain: Secondary | ICD-10-CM

## 2015-05-05 DIAGNOSIS — E785 Hyperlipidemia, unspecified: Secondary | ICD-10-CM | POA: Diagnosis not present

## 2015-05-05 DIAGNOSIS — I10 Essential (primary) hypertension: Secondary | ICD-10-CM

## 2015-05-05 DIAGNOSIS — D649 Anemia, unspecified: Secondary | ICD-10-CM

## 2015-05-05 DIAGNOSIS — E669 Obesity, unspecified: Secondary | ICD-10-CM | POA: Diagnosis not present

## 2015-05-05 DIAGNOSIS — E113299 Type 2 diabetes mellitus with mild nonproliferative diabetic retinopathy without macular edema, unspecified eye: Secondary | ICD-10-CM

## 2015-05-05 DIAGNOSIS — R05 Cough: Secondary | ICD-10-CM | POA: Diagnosis not present

## 2015-05-05 DIAGNOSIS — E11329 Type 2 diabetes mellitus with mild nonproliferative diabetic retinopathy without macular edema: Secondary | ICD-10-CM

## 2015-05-05 DIAGNOSIS — R059 Cough, unspecified: Secondary | ICD-10-CM

## 2015-05-05 LAB — CBC
HEMATOCRIT: 40 % (ref 39.0–52.0)
HEMOGLOBIN: 14.2 g/dL (ref 13.0–17.0)
MCH: 32.1 pg (ref 26.0–34.0)
MCHC: 35.5 g/dL (ref 30.0–36.0)
MCV: 90.5 fL (ref 78.0–100.0)
MPV: 9.5 fL (ref 8.6–12.4)
Platelets: 231 10*3/uL (ref 150–400)
RBC: 4.42 MIL/uL (ref 4.22–5.81)
RDW: 13.3 % (ref 11.5–15.5)
WBC: 4.9 10*3/uL (ref 4.0–10.5)

## 2015-05-05 LAB — COMPREHENSIVE METABOLIC PANEL
ALBUMIN: 3.8 g/dL (ref 3.6–5.1)
ALT: 14 U/L (ref 9–46)
AST: 18 U/L (ref 10–35)
Alkaline Phosphatase: 53 U/L (ref 40–115)
BUN: 11 mg/dL (ref 7–25)
CALCIUM: 8.9 mg/dL (ref 8.6–10.3)
CO2: 23 mmol/L (ref 20–31)
CREATININE: 0.84 mg/dL (ref 0.70–1.18)
Chloride: 102 mmol/L (ref 98–110)
GLUCOSE: 121 mg/dL — AB (ref 65–99)
Potassium: 4.2 mmol/L (ref 3.5–5.3)
SODIUM: 136 mmol/L (ref 135–146)
Total Bilirubin: 0.8 mg/dL (ref 0.2–1.2)
Total Protein: 7 g/dL (ref 6.1–8.1)

## 2015-05-05 LAB — GLUCOSE, POCT (MANUAL RESULT ENTRY): POC Glucose: 120 mg/dl — AB (ref 70–99)

## 2015-05-05 LAB — LIPID PANEL
Cholesterol: 238 mg/dL — ABNORMAL HIGH (ref 125–200)
HDL: 35 mg/dL — AB (ref >=40)
LDL Cholesterol: 148 mg/dL — ABNORMAL HIGH (ref <130)
Total CHOL/HDL Ratio: 6.8 Ratio — ABNORMAL HIGH (ref <=5.0)
Triglycerides: 276 mg/dL — ABNORMAL HIGH (ref <150)
VLDL: 55 mg/dL — ABNORMAL HIGH (ref <30)

## 2015-05-05 LAB — POCT GLYCOSYLATED HEMOGLOBIN (HGB A1C): Hemoglobin A1C: 7.2

## 2015-05-05 MED ORDER — POLYETHYLENE GLYCOL 3350 17 GM/SCOOP PO POWD: 17.0000 g | Freq: Two times a day (BID) | ORAL | Status: DC | PRN

## 2015-05-05 MED ORDER — AZITHROMYCIN 500 MG PO TABS: 500.0000 mg | ORAL_TABLET | Freq: Every day | ORAL | Status: DC

## 2015-05-05 NOTE — Progress Notes (Signed)
Patient ID: Jerry Zimmerman, male   DOB: 1941/04/14, 73 y.o.   MRN: [id number] Reviewed documentation and xray and agree w/ assessment and plan. Norberto Sorenson, MD MPH

## 2015-05-05 NOTE — Progress Notes (Signed)
Patient ID: Jerry Zimmerman, male    DOB: 14-Jun-1941, 74 y.o.   MRN: [id number]  PCP: JEFFERY,[name], PA-C  Subjective:   Chief Complaint  Patient presents with  . Follow-up  . Diabetes  . pressure in lungs    x 2 wks  . Cough    x 2 wks  . Medication Refill    HPI Presents for evaluation of diabetes, hyperlipidemia and two new complaints: cough and abdominal pressure x 2 weeks.  He reports that he's tolerating his medications well without adverse effects. Recently returned from [place] to participate in his daughter's wedding festivities. About 2 weeks ago he had a cold with a cough and the cough hasn't resolved. He thinks that the pressure he feels in his lungs and abdomen are due to constipation, as he has noticed that he's not having BMs as frequently as usual.  History of surgery for "biles" (perhaps "piles"?-hemorrhoids). Also history of RIGHT inguinal hernia repair.  No loose stools. No blood or dark tarry colored stools. No pain with BM.  No fever, chills, nausea or vomiting. No SOB, CP, dizziness.  He does not check glucose at home. Current with eye exam, but doesn't see a dentist regularly. Normal colonoscopy in 2014.  Review of Systems As above.    Patient Active Problem List   Diagnosis Date Noted  . Diabetic retinopathy 07/09/2014  . 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 Essential hypertension, benign   . Obesity   . SLEEP APNEA 05/17/2009  . DM (diabetes mellitus), type 2 with ophthalmic complications 05/16/2009  . Hyperlipidemia 05/16/2009     Prior to Admission medications   Medication Sig Start Date End Date Taking? Authorizing Provider  aspirin 81 MG tablet Take 81 mg by mouth daily.   Yes Historical Provider, MD  gemfibrozil (LOPID) 600 MG tablet TAKE ONE TABLET BY MOUTH TWICE DAILY BEFORE MEAL(S) 11/05/14  Yes Morrell Riddle, PA-C  lisinopril (PRINIVIL,ZESTRIL) 5 MG tablet Take 1 tablet (5 mg total) by mouth daily. 11/05/14  Yes Morrell Riddle, PA-C  metFORMIN (GLUCOPHAGE) 500 MG tablet TAKE ONE TABLET BY MOUTH ONCE DAILY WITH BREAKFAST 11/05/14  Yes Morrell Riddle, PA-C  zoster vaccine live, PF, (ZOSTAVAX) 14782 UNT/0.65ML injection Inject 19,400 Units into the skin once. Patient not taking: Reported on 05/05/2015 02/10/15   Porfirio Oar, PA-C     No Known Allergies     Objective:  Physical Exam  Constitutional: He is oriented to person, place, and time. Vital signs are normal. He appears well-developed and well-nourished. He is active and cooperative. No distress.  BP 140/80 mmHg  Pulse 75  Temp(Src) 98.7 F (37.1 C) (Oral)  Resp 16  Ht 5' 6.25" (1.683 m)  Wt 188 lb 3.2 oz (85.367 kg)  BMI 30.14 kg/m2  SpO2 97%  HENT:  Head: Normocephalic and atraumatic.  Right Ear: Hearing normal.  Left Ear: Hearing normal.  Eyes: Conjunctivae are normal. No scleral icterus.  Neck: Normal range of motion. Neck supple. No thyromegaly present.  Cardiovascular: Normal rate, regular rhythm and normal heart sounds.   Pulses:      Radial pulses are 2+ on the right side, and 2+ on the left side.  Pulmonary/Chest: Effort normal and breath sounds normal.  Abdominal: Soft. Bowel sounds are normal. He exhibits no distension and no mass. There is no hepatosplenomegaly. There is tenderness (lower quadrants bilaterally). There is no rebound, no guarding and no CVA tenderness. No hernia. Inguinal: fullness at  site of previous hernia repair, no hernia defect palpable.  Lymphadenopathy:       Head (right side): No tonsillar, no preauricular, no posterior auricular and no occipital adenopathy present.       Head (left side): No tonsillar, no preauricular, no posterior auricular and no occipital adenopathy present.    He has no cervical adenopathy.       Right: No supraclavicular adenopathy present.       Left: No supraclavicular adenopathy present.  Neurological: He is alert and oriented to person, place, and time. No sensory deficit.  Skin:  Skin is warm, dry and intact. No rash noted. No cyanosis or erythema. Nails show no clubbing.  Psychiatric: He has a normal mood and affect. His speech is normal and behavior is normal.       Results for orders placed or performed in visit on 05/05/15  POCT glucose (manual entry)  Result Value Ref Range   POC Glucose 120 (A) 70 - 99 mg/dl  POCT glycosylated hemoglobin (Hb A1C)  Result Value Ref Range   Hemoglobin A1C 7.2     Acute Abdominal Series: [facility] reading (PRIMARY) by  Dr. Clelia Croft. Large stool burden throughout the colon, otherwise normal abdomen. Area of possible consolidation in the RIGHT lower lung field.      Assessment & Plan:   1. Type 2 diabetes mellitus with mild nonproliferative diabetic retinopathy without macular edema Stable. Adequate control. Continue current treatment. - POCT glucose (manual entry) - POCT glycosylated hemoglobin (Hb A1C) - Comprehensive metabolic panel  2. Essential hypertension, benign Controlled.  3. Hyperlipidemia Await labs. - Lipid panel  4. Obesity Encouraged to work on healthy eating and regular exercise.  5. Anemia, unspecified anemia type Await lab results.  - CBC  6. Need for influenza vaccination Refused, as usual, despite encouragement.  7. Generalized abdominal pain Constipation. See below. - DG Abd Acute W/Chest  8. Cough Initially I thought this was likely post-viral cough. However, with the abnormality seen on xray, elect to cover for bacterial process. If he is not improving in the next week, return for re-evaluation. Plan repeat CXR at his visit in 06/2015. - azithromycin (ZITHROMAX) 500 MG tablet; Take 1 tablet (500 mg total) by mouth daily.  Dispense: 3 tablet; Refill: 0  9. Constipation, unspecified constipation type Fruits, veggies, whole grains. Miralax BID as needed. - polyethylene glycol powder (GLYCOLAX/MIRALAX) powder; Take 17 g by mouth 2 (two) times daily as needed.  Dispense: 3350 g; Refill:  1  Discussed with Dr. Clelia Croft.  Fernande Bras, PA-C Physician Assistant-Certified [facility]

## 2015-05-05 NOTE — Patient Instructions (Addendum)
I will contact you with your lab results as soon as they are available.   If you have not heard from me in 2 weeks, please contact me.  The fastest way to get your results is to register for My Chart (see the instructions on the last page of this printout).  Keep taking all your medications!!!

## 2015-05-05 NOTE — Telephone Encounter (Signed)
Pharm called to clarify quantity for miralax. Correct quantity for 90 days is 3060 g. I gave VO to change and will change in EPIC.

## 2015-05-10 ENCOUNTER — Encounter: Payer: Self-pay | Admitting: Physician Assistant

## 2015-05-19 ENCOUNTER — Ambulatory Visit: Payer: 59 | Admitting: Physician Assistant

## 2015-05-24 ENCOUNTER — Telehealth: Payer: Self-pay | Admitting: Family Medicine

## 2015-06-21 ENCOUNTER — Ambulatory Visit (INDEPENDENT_AMBULATORY_CARE_PROVIDER_SITE_OTHER): Payer: 59 | Admitting: Emergency Medicine

## 2015-06-21 ENCOUNTER — Ambulatory Visit (INDEPENDENT_AMBULATORY_CARE_PROVIDER_SITE_OTHER): Payer: 59

## 2015-06-21 ENCOUNTER — Encounter: Payer: Self-pay | Admitting: Physician Assistant

## 2015-06-21 VITALS — BP 140/76 | HR 78 | Temp 97.9°F | Resp 16 | Ht 66.5 in | Wt 188.6 lb

## 2015-06-21 DIAGNOSIS — J309 Allergic rhinitis, unspecified: Secondary | ICD-10-CM

## 2015-06-21 DIAGNOSIS — R938 Abnormal findings on diagnostic imaging of other specified body structures: Secondary | ICD-10-CM

## 2015-06-21 DIAGNOSIS — R9389 Abnormal findings on diagnostic imaging of other specified body structures: Secondary | ICD-10-CM

## 2015-06-21 MED ORDER — FLUTICASONE PROPIONATE 50 MCG/ACT NA SUSP: 2.0000 | Freq: Every day | NASAL | Status: DC

## 2015-06-21 NOTE — Patient Instructions (Signed)
Use the new nose spray. 2 sprays in each nostril one time each day. You may also add an OTC oral antihistamine (Allegra, Claritin or Zyrtec) to help.

## 2015-06-21 NOTE — Progress Notes (Signed)
Patient ID: Jerry Zimmerman, male    DOB: [birth year], 74 y.o.   MRN: [id number]  PCP: Clemon Devaul, PA-C  Subjective:   Chief Complaint  Patient presents with  . Follow-up  . sneezing and nasal congestion  . Repeat CXR    per pt cough is "better"    HPI Presents for evaluation of recent cough and possible RLL infiltrate.  On 05/05/2015 he was here for routine HTN, lipid and diabetes visit with a cough and abdominal pain. Acute abdominal series was notable for moderate stool burden and possible area of consolidation in the RLL. He was treated with azithromycin for possible pneumonia. Over read was NORMAL.   Today he reports that the cough is better, but that he continues with nasal congestion and sneezing. He sneezes 5-8 times in a row, and reports a runny nose when he moves between inside and outside with the colder weather and indoor heat.    Review of Systems  Constitutional: Negative.   HENT: Positive for congestion, rhinorrhea and sneezing. Negative for ear pain, nosebleeds, postnasal drip, sinus pressure and sore throat.   Eyes: Negative for visual disturbance.  Respiratory: Negative for cough, chest tightness, shortness of breath and wheezing.   Cardiovascular: Negative for chest pain and palpitations.  Gastrointestinal: Negative for abdominal pain.       Patient Active Problem List   Diagnosis Date Noted  . Diabetic retinopathy (HCC) 07/09/2014  . 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 Essential hypertension, benign   . Obesity   . SLEEP APNEA 05/17/2009  . DM (diabetes mellitus), type 2 with ophthalmic complications (HCC) 05/16/2009  . Hyperlipidemia 05/16/2009     Prior to Admission medications   Medication Sig Start Date End Date Taking? Authorizing Provider  aspirin 81 MG tablet Take 81 mg by mouth daily.   Yes Historical Provider, MD  gemfibrozil (LOPID) 600 MG tablet TAKE ONE TABLET BY MOUTH TWICE DAILY BEFORE MEAL(S) 11/05/14  Yes Morrell Riddle, PA-C  lisinopril (PRINIVIL,ZESTRIL) 5 MG tablet Take 1 tablet (5 mg total) by mouth daily. 11/05/14  Yes Morrell Riddle, PA-C  metFORMIN (GLUCOPHAGE) 500 MG tablet TAKE ONE TABLET BY MOUTH ONCE DAILY WITH BREAKFAST 11/05/14  Yes Morrell Riddle, PA-C  polyethylene glycol powder (GLYCOLAX/MIRALAX) powder Take 17 g by mouth 2 (two) times daily as needed. Patient not taking: Reported on 06/21/2015 05/05/15   Porfirio Oar, PA-C  zoster vaccine live, PF, (ZOSTAVAX) 78469 UNT/0.65ML injection Inject 19,400 Units into the skin once. Patient not taking: Reported on 05/05/2015 02/10/15   Porfirio Oar, PA-C     No Known Allergies     Objective:  Physical Exam  Constitutional: He is oriented to person, place, and time. He appears well-developed and well-nourished. He is active and cooperative. No distress.  BP 140/76 mmHg  Pulse 78  Temp(Src) 97.9 F (36.6 C) (Oral)  Resp 16  Ht 5' 6.5" (1.689 m)  Wt 188 lb 9.6 oz (85.548 kg)  BMI 29.99 kg/m2  SpO2 96%  HENT:  Head: Normocephalic and atraumatic.  Right Ear: Hearing, tympanic membrane, external ear and ear canal normal.  Left Ear: Hearing, tympanic membrane, external ear and ear canal normal.  Nose: Nose normal.  Mouth/Throat: Uvula is midline, oropharynx is clear and moist and mucous membranes are normal. No oral lesions.  Eyes: Conjunctivae are normal. No scleral icterus.  Neck: Normal range of motion and phonation normal. Neck supple. No thyromegaly present.  Cardiovascular: Normal rate, regular rhythm  and normal heart sounds.   Pulses:      Radial pulses are 2+ on the right side, and 2+ on the left side.  Pulmonary/Chest: Effort normal and breath sounds normal.  Lymphadenopathy:       Head (right side): No tonsillar, no preauricular, no posterior auricular and no occipital adenopathy present.       Head (left side): No tonsillar, no preauricular, no posterior auricular and no occipital adenopathy present.    He has no cervical  adenopathy.       Right: No supraclavicular adenopathy present.       Left: No supraclavicular adenopathy present.  Neurological: He is alert and oriented to person, place, and time. No sensory deficit.  Skin: Skin is warm, dry and intact. No rash noted. No cyanosis or erythema. Nails show no clubbing.  Psychiatric: He has a normal mood and affect. His speech is normal and behavior is normal.       CXR: [facility] reading (PRIMARY) by  Dr. Cleta Albertsaub. Normal chest films. No infiltrate.     Assessment & Plan:   1. Abnormal CXR Normal films today. Cough is resolved. No additional follow-up. - DG Chest 2 View; Future  2. Allergic rhinitis, unspecified allergic rhinitis type Anticipatory guidance provided. - fluticasone (FLONASE) 50 MCG/ACT nasal spray; Place 2 sprays into both nostrils daily.  Dispense: 16 g; Refill: 12   Fernande Brashelle S. Dondra Rhett, PA-C Physician Assistant-Certified [facility]

## 2015-06-28 ENCOUNTER — Telehealth: Payer: Self-pay | Admitting: Family Medicine

## 2015-06-28 NOTE — Telephone Encounter (Signed)
lmom to call and reschedule his appt that he had with chelle in December for DM check

## 2015-07-19 NOTE — Telephone Encounter (Signed)
ERROR

## 2015-07-27 ENCOUNTER — Encounter: Payer: Self-pay | Admitting: Physician Assistant

## 2015-07-27 ENCOUNTER — Ambulatory Visit (INDEPENDENT_AMBULATORY_CARE_PROVIDER_SITE_OTHER): Payer: 59 | Admitting: Physician Assistant

## 2015-07-27 VITALS — BP 179/87 | HR 76 | Temp 97.4°F | Resp 16 | Ht 67.0 in | Wt 188.0 lb

## 2015-07-27 DIAGNOSIS — I10 Essential (primary) hypertension: Secondary | ICD-10-CM | POA: Diagnosis not present

## 2015-07-27 DIAGNOSIS — E119 Type 2 diabetes mellitus without complications: Secondary | ICD-10-CM | POA: Diagnosis not present

## 2015-07-27 DIAGNOSIS — E113291 Type 2 diabetes mellitus with mild nonproliferative diabetic retinopathy without macular edema, right eye: Secondary | ICD-10-CM | POA: Diagnosis not present

## 2015-07-27 DIAGNOSIS — Z23 Encounter for immunization: Secondary | ICD-10-CM | POA: Diagnosis not present

## 2015-07-27 DIAGNOSIS — E785 Hyperlipidemia, unspecified: Secondary | ICD-10-CM

## 2015-07-27 LAB — GLUCOSE, POCT (MANUAL RESULT ENTRY): POC GLUCOSE: 128 mg/dL — AB (ref 70–99)

## 2015-07-27 LAB — POCT GLYCOSYLATED HEMOGLOBIN (HGB A1C): Hemoglobin A1C: 7.2

## 2015-07-27 MED ORDER — GEMFIBROZIL 600 MG PO TABS: ORAL_TABLET | ORAL | Status: DC

## 2015-07-27 MED ORDER — LISINOPRIL 10 MG PO TABS: 10.0000 mg | ORAL_TABLET | Freq: Every day | ORAL | Status: DC

## 2015-07-27 MED ORDER — METFORMIN HCL 500 MG PO TABS: ORAL_TABLET | ORAL | Status: DC

## 2015-07-27 MED ORDER — ZOSTER VACCINE LIVE 19400 UNT/0.65ML ~~LOC~~ SOLR: 0.6500 mL | Freq: Once | SUBCUTANEOUS | Status: DC

## 2015-07-27 MED ORDER — PRAVASTATIN SODIUM 20 MG PO TABS: 20.0000 mg | ORAL_TABLET | Freq: Every day | ORAL | Status: DC

## 2015-07-27 NOTE — Progress Notes (Signed)
Patient ID: Jerry DoomsMohamed Zimmerman, male    DOB: 05/25/1941, 74 y.o.   MRN: [id number]  PCP: Marshayla Mitschke, PA-C  Subjective:   Chief Complaint  Patient presents with  . Follow-up  . Diabetes  . Hypertension  . Hyperlipidemia  . Medication Refill    HPI Presents for evaluation of Diabetes, HTN and hyperlipidemia.  He is tolerating his medications well, again stating that he can't tell that they are doing anything because he feels the same whether he takes them or not.  He notes that they letter he received after his last labs said that I was adding atorvastatin to his regimen, but that I never sent it to his pharmacy. Review of the record reveals that is true, and I apologized. However, labeling on gemfibrozil now warns against concomitant use with atorvastatin and simvastatin and recommends cautious and low doses if other statins are used.  Says his BP is elevated today because he's nervous, and because he had to stay late at work, and didn't get sleep.  Review of Systems  Constitutional: Negative for activity change, appetite change, fatigue and unexpected weight change.  HENT: Negative for congestion, dental problem, ear pain, hearing loss, mouth sores, postnasal drip, rhinorrhea, sneezing, sore throat, tinnitus and trouble swallowing.   Eyes: Negative for photophobia, pain, redness and visual disturbance.  Respiratory: Negative for cough, chest tightness and shortness of breath.   Cardiovascular: Negative for chest pain, palpitations and leg swelling.  Gastrointestinal: Negative for nausea, vomiting, abdominal pain, diarrhea, constipation and blood in stool.  Genitourinary: Negative for dysuria, urgency, frequency and hematuria.  Musculoskeletal: Negative for myalgias, arthralgias, gait problem and neck stiffness.  Skin: Negative for rash.  Neurological: Negative for dizziness, speech difficulty, weakness, light-headedness, numbness and headaches.  Hematological: Negative for  adenopathy.  Psychiatric/Behavioral: Negative for confusion and sleep disturbance. The patient is not nervous/anxious.        Patient Active Problem List   Diagnosis Date Noted  . Diabetic retinopathy (HCC) 07/09/2014  . 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 Essential hypertension, benign   . Obesity   . SLEEP APNEA 05/17/2009  . DM (diabetes mellitus), type 2 with ophthalmic complications (HCC) 05/16/2009  . Hyperlipidemia 05/16/2009     Prior to Admission medications   Medication Sig Start Date End Date Taking? Authorizing Provider  aspirin 81 MG tablet Take 81 mg by mouth daily.   Yes Historical Provider, MD  fluticasone (FLONASE) 50 MCG/ACT nasal spray Place 2 sprays into both nostrils daily. 06/21/15  Yes Henny Strauch, PA-C  gemfibrozil (LOPID) 600 MG tablet TAKE ONE TABLET BY MOUTH TWICE DAILY BEFORE MEAL(S) 11/05/14  Yes Morrell RiddleSarah L Weber, PA-C  lisinopril (PRINIVIL,ZESTRIL) 5 MG tablet Take 1 tablet (5 mg total) by mouth daily. 11/05/14  Yes Morrell RiddleSarah L Weber, PA-C  metFORMIN (GLUCOPHAGE) 500 MG tablet TAKE ONE TABLET BY MOUTH ONCE DAILY WITH BREAKFAST 11/05/14  Yes Morrell RiddleSarah L Weber, PA-C  zoster vaccine live, PF, (ZOSTAVAX) [id number] UNT/0.65ML injection Inject 19,400 Units into the skin once. Patient not taking: Reported on 05/05/2015 02/10/15   Porfirio Oarhelle Jadin Kagel, PA-C     No Known Allergies     Objective:  Physical Exam  Constitutional: He is oriented to person, place, and time. Vital signs are normal. He appears well-developed and well-nourished. He is active and cooperative. No distress.  BP 179/87 mmHg  Pulse 76  Temp(Src) 97.4 F (36.3 C)  Resp 16  Ht 5\' 7"  (1.702 m)  Wt 188 lb (85.276 kg)  BMI 29.44 kg/m2  HENT:  Head: Normocephalic and atraumatic.  Right Ear: Hearing normal.  Left Ear: Hearing normal.  Eyes: Conjunctivae are normal. No scleral icterus.  Neck: Normal range of motion. Neck supple. No thyromegaly present.  Cardiovascular: Normal rate, regular  rhythm and normal heart sounds.   Pulses:      Radial pulses are 2+ on the right side, and 2+ on the left side.  LEFT leg varicosity is stable  Pulmonary/Chest: Effort normal and breath sounds normal.  Lymphadenopathy:       Head (right side): No tonsillar, no preauricular, no posterior auricular and no occipital adenopathy present.       Head (left side): No tonsillar, no preauricular, no posterior auricular and no occipital adenopathy present.    He has no cervical adenopathy.       Right: No supraclavicular adenopathy present.       Left: No supraclavicular adenopathy present.  Neurological: He is alert and oriented to person, place, and time. No sensory deficit.  Skin: Skin is warm, dry and intact. No rash noted. No cyanosis or erythema. Nails show no clubbing.  Psychiatric: He has a normal mood and affect.     Results for orders placed or performed in visit on 07/27/15  POCT glucose (manual entry)  Result Value Ref Range   POC Glucose 128 (A) 70 - 99 mg/dl  POCT glycosylated hemoglobin (Hb A1C)  Result Value Ref Range   Hemoglobin A1C 7.2          Assessment & Plan:   1. 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 Continue current treatment. - POCT glucose (manual entry) - POCT glycosylated hemoglobin (Hb A1C) - Comprehensive metabolic panel - metFORMIN (GLUCOPHAGE) 500 MG tablet; TAKE ONE TABLET BY MOUTH ONCE DAILY WITH BREAKFAST  Dispense: 90 tablet; Refill: 3  2. Essential hypertension, benign Above goal. Increase lisinopril from 5 mg to 10 mg daily. - Comprehensive metabolic panel - lisinopril (PRINIVIL,ZESTRIL) 10 MG tablet; Take 1 tablet (10 mg total) by mouth daily.  Dispense: 90 tablet; Refill: 3  3. Hyperlipidemia Await lab results. Add pravastatin. Monitor renal function and for muscle ache. - Lipid panel - gemfibrozil (LOPID) 600 MG tablet;  TAKE ONE TABLET BY MOUTH TWICE DAILY BEFORE MEAL(S)  Dispense: 180 tablet; Refill: 3 - pravastatin (PRAVACHOL) 20 MG tablet; Take 1 tablet (20 mg total) by mouth daily.  Dispense: 90 tablet; Refill: 3  4. Need for shingles vaccine Again provided Rx and instructions. - zoster vaccine live, PF, (ZOSTAVAX) [id number] UNT/0.65ML injection; Inject 19,400 Units into the skin once.  Dispense: 0.65 mL; Refill: 0    Return in about 3 months (around 10/25/2015) for re-evaluation of diabetes.    Fernande Bras, PA-C Physician Assistant-Certified [facility]

## 2015-07-27 NOTE — Patient Instructions (Addendum)
I will contact you with your lab results as soon as they are available.   If you have not heard from me in 2 weeks, please contact me.  The fastest way to get your results is to register for My Chart (see the instructions on the last page of this printout).  I have increased the lisinopril from 5 mg to 10 mg to help lower the blood pressure.  I have ADDED pravastatin for the cholesterol. Please take it one time each day, and continue the gemfibrozil twice each day.

## 2015-07-28 ENCOUNTER — Encounter: Payer: Self-pay | Admitting: Physician Assistant

## 2015-07-28 LAB — COMPREHENSIVE METABOLIC PANEL
ALK PHOS: 58 U/L (ref 40–115)
ALT: 16 U/L (ref 9–46)
AST: 21 U/L (ref 10–35)
Albumin: 3.6 g/dL (ref 3.6–5.1)
BILIRUBIN TOTAL: 0.7 mg/dL (ref 0.2–1.2)
BUN: 11 mg/dL (ref 7–25)
CO2: 22 mmol/L (ref 20–31)
Calcium: 8.8 mg/dL (ref 8.6–10.3)
Chloride: 105 mmol/L (ref 98–110)
Creat: 0.88 mg/dL (ref 0.70–1.18)
GLUCOSE: 112 mg/dL — AB (ref 65–99)
POTASSIUM: 3.9 mmol/L (ref 3.5–5.3)
Sodium: 139 mmol/L (ref 135–146)
TOTAL PROTEIN: 6.9 g/dL (ref 6.1–8.1)

## 2015-07-28 LAB — LIPID PANEL
CHOL/HDL RATIO: 5.7 ratio — AB (ref <=5.0)
CHOLESTEROL: 183 mg/dL (ref 125–200)
HDL: 32 mg/dL — ABNORMAL LOW (ref >=40)
LDL Cholesterol: 97 mg/dL (ref <130)
Triglycerides: 272 mg/dL — ABNORMAL HIGH (ref <150)
VLDL: 54 mg/dL — ABNORMAL HIGH (ref <30)

## 2015-08-02 ENCOUNTER — Ambulatory Visit: Payer: 59 | Admitting: Physician Assistant

## 2015-08-02 ENCOUNTER — Telehealth: Payer: Self-pay

## 2015-08-02 NOTE — Telephone Encounter (Signed)
Received fax from pharm advising that it is not recommended to have pt taking both pravastatin and Lopid. [name], please advise.

## 2015-08-02 NOTE — Telephone Encounter (Signed)
Please call this patient.  It is no longer recommended that he take BOTH Lopid and Pravastatin for his cholesterol.  Please advise him to STOP the Lopid, and continue the Pravastatin.

## 2015-08-03 NOTE — Telephone Encounter (Signed)
LMOM for pt to CB about medication changes. Give [name]'s instr's below.

## 2015-08-04 NOTE — Telephone Encounter (Signed)
Pt CB. There was a slight language barrier, but advised pt of change in chol med therapy and also clarified increase in lisinopril dose. Pt repeated new directions back to me and agreed to changes.

## 2015-09-22 LAB — HM DIABETES EYE EXAM

## 2015-10-03 ENCOUNTER — Encounter: Payer: Self-pay | Admitting: Family Medicine

## 2015-10-25 ENCOUNTER — Ambulatory Visit: Payer: 59 | Admitting: Physician Assistant

## 2015-11-22 ENCOUNTER — Encounter: Payer: Self-pay | Admitting: Physician Assistant

## 2015-11-22 ENCOUNTER — Ambulatory Visit (INDEPENDENT_AMBULATORY_CARE_PROVIDER_SITE_OTHER): Payer: 59 | Admitting: Physician Assistant

## 2015-11-22 VITALS — BP 150/86 | HR 77 | Temp 98.4°F | Resp 16 | Ht 67.0 in | Wt 183.8 lb

## 2015-11-22 DIAGNOSIS — E785 Hyperlipidemia, unspecified: Secondary | ICD-10-CM | POA: Diagnosis not present

## 2015-11-22 DIAGNOSIS — I10 Essential (primary) hypertension: Secondary | ICD-10-CM

## 2015-11-22 DIAGNOSIS — E113291 Type 2 diabetes mellitus with mild nonproliferative diabetic retinopathy without macular edema, right eye: Secondary | ICD-10-CM | POA: Diagnosis not present

## 2015-11-22 DIAGNOSIS — Z23 Encounter for immunization: Secondary | ICD-10-CM

## 2015-11-22 LAB — COMPREHENSIVE METABOLIC PANEL
ALBUMIN: 3.6 g/dL (ref 3.6–5.1)
ALK PHOS: 51 U/L (ref 40–115)
ALT: 13 U/L (ref 9–46)
AST: 20 U/L (ref 10–35)
BILIRUBIN TOTAL: 0.8 mg/dL (ref 0.2–1.2)
BUN: 11 mg/dL (ref 7–25)
CALCIUM: 8.5 mg/dL — AB (ref 8.6–10.3)
CO2: 26 mmol/L (ref 20–31)
Chloride: 103 mmol/L (ref 98–110)
Creat: 0.79 mg/dL (ref 0.70–1.18)
GLUCOSE: 104 mg/dL — AB (ref 65–99)
POTASSIUM: 3.8 mmol/L (ref 3.5–5.3)
Sodium: 138 mmol/L (ref 135–146)
TOTAL PROTEIN: 6.9 g/dL (ref 6.1–8.1)

## 2015-11-22 LAB — LIPID PANEL
CHOL/HDL RATIO: 5 ratio (ref <=5.0)
CHOLESTEROL: 171 mg/dL (ref 125–200)
HDL: 34 mg/dL — AB (ref >=40)
LDL Cholesterol: 104 mg/dL (ref <130)
TRIGLYCERIDES: 164 mg/dL — AB (ref <150)
VLDL: 33 mg/dL — ABNORMAL HIGH (ref <30)

## 2015-11-22 LAB — HEMOGLOBIN A1C
Hgb A1c MFr Bld: 7.1 % — ABNORMAL HIGH
Mean Plasma Glucose: 157 mg/dL

## 2015-11-22 MED ORDER — ZOSTER VACCINE LIVE 19400 UNT/0.65ML ~~LOC~~ SOLR: 0.6500 mL | Freq: Once | SUBCUTANEOUS | Status: DC

## 2015-11-22 MED ORDER — LISINOPRIL 20 MG PO TABS: 20.0000 mg | ORAL_TABLET | Freq: Every day | ORAL | Status: DC

## 2015-11-22 NOTE — Progress Notes (Signed)
Subjective:     Patient ID: Jerry Zimmerman, male   DOB: 10/04/1940, 75 y.o.   MRN: [id number] Chief Complaint  Patient presents with  . Diabetes  . Hypertension    HPI Patient here today for diabetes, hypertension, and hyperlipidemia follow up.  He is tolerating his medications well, again stating that he can't tell that they are doing anything because he feels the same whether he  Is taking them or not. Hgb A1c was 7.2% in 07/2015.  He continues to have elevated blood pressure. He does not check his blood pressure at home regularly.He complains of swelling in his legs that is worse after standing long periods. Minimal amount of salt intake.  He did not receive a zostavax vaccine last year.   He is requesting a blood sugar monitor and blood pressure machine for home.  Review of Systems  Constitutional: Negative for fever, chills, activity change, appetite change and fatigue.  Eyes: Negative.   Respiratory: Negative for chest tightness and shortness of breath.   Cardiovascular: Negative for chest pain.  Gastrointestinal: Negative for nausea, vomiting, abdominal pain and diarrhea.  Musculoskeletal: Negative for myalgias and arthralgias.  Neurological: Negative for dizziness, syncope and headaches.       Objective:   Physical Exam  Constitutional: He is oriented to person, place, and time. He appears well-developed and well-nourished. No distress.  HENT:  Head: Normocephalic and atraumatic.  Right Ear: External ear normal.  Left Ear: External ear normal.  Mouth/Throat: Oropharynx is clear and moist.  Eyes: Pupils are equal, round, and reactive to light.  Neck: Normal range of motion. Neck supple. No thyromegaly present.  Cardiovascular: Normal rate, regular rhythm, normal heart sounds and intact distal pulses.   Pulmonary/Chest: Effort normal and breath sounds normal.  Abdominal: Soft. Bowel sounds are normal.  Lymphadenopathy:    He has no cervical adenopathy.  Neurological:  He is alert and oriented to person, place, and time. He has normal reflexes.  Skin: Skin is warm and dry.  Significant varicose veins present lower extremities bilaterally   Psychiatric: He has a normal mood and affect. His behavior is normal. Judgment and thought content normal.   Filed Vitals:   11/22/15 1133 11/22/15 1138  BP: 165/86 150/86  Pulse: 77   Temp: 98.4 F (36.9 C)   Resp: 16        Assessment/Plan:     1. 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 Will evaluate need for medication change. - Hemoglobin A1c - Comprehensive metabolic panel - Microalbumin, urine  2. Hyperlipidemia Awaiting labs. - Lipid panel  3. Essential hypertension, benign Blood pressure elevated today (150-165/86). Has been elevated last few visits. Will increase lisinopril from 10 mg to 20 mg and re evaluate. - Comprehensive metabolic panel - lisinopril (PRINIVIL,ZESTRIL) 20 MG tablet; Take 1 tablet (20 mg total) by mouth daily.  Dispense: 90 tablet; Refill: 3  4. Need for shingles vaccine - zoster vaccine live, PF, (ZOSTAVAX) [id number] UNT/0.65ML injection; Inject 19,400 Units into the skin once.  Dispense: 0.65 mL; Refill: 0   Azucena Kubayler Jhonatan Lomeli PA-S 11/22/2015

## 2015-11-22 NOTE — Patient Instructions (Addendum)
1. Your blood pressure is still too high. I need to INCREASE the lisinopril. Use up the 10 mg tablets that you have at home by taking 2 of them together each day. When they are gone, start the new prescription for the 20 mg tablets (one each day) that I sent to the pharmacy.  2. Please take the printed prescription to the pharmacy for the shingles vaccine. They will administer the shot there.    IF you received an x-ray today, [facility] will receive an [facility] from [facility]. Please contact [facility] at [phone] with questions or concerns regarding your invoice.   IF you received labwork today, you will receive an invoice from [facility]. Please contact [facility] at [phone] with questions or concerns regarding your invoice.   Our billing staff will not be able to assist you with questions regarding bills from these companies.  You will be contacted with the lab results as soon as they are available. The fastest way to get your results is to activate your My Chart account. Instructions are located on the last page of this paperwork. If you have not heard from us regarding the results in 2 weeks, please contact this office.

## 2015-11-22 NOTE — Progress Notes (Signed)
Patient ID: Jerry Zimmerman, male    DOB: February 28, 1941, 75 y.o.   MRN: [id number]  PCP: Legacy Carrender, PA-C  Subjective:   Chief Complaint  Patient presents with  . Diabetes  . Hypertension    HPI Presents for evaluation of diabetes, hypertension, and hyperlipidemia.  He is tolerating his medications well, again stating that he can't tell that they are doing anything because he feels the same whether he Is taking them or not. Hgb A1c was 7.2% in 07/2015.  He continues to have elevated blood pressure. He does not check his blood pressure at home regularly.He complains of swelling in his legs that is worse after standing long periods. Minimal amount of salt intake.  He did not receive a zostavax vaccine last year.   He is requesting a blood sugar monitor and blood pressure machine for home.   Review of Systems Constitutional: Negative for fever, chills, activity change, appetite change and fatigue.  Eyes: Negative.  Respiratory: Negative for chest tightness and shortness of breath.  Cardiovascular: Negative for chest pain.  Gastrointestinal: Negative for nausea, vomiting, abdominal pain and diarrhea.  Musculoskeletal: Negative for myalgias and arthralgias.  Neurological: Negative for dizziness, syncope and headaches.     Patient Active Problem List   Diagnosis Date Noted  . Diabetic retinopathy (HCC) 07/09/2014  . 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 Essential hypertension, benign   . Obesity   . SLEEP APNEA 05/17/2009  . DM (diabetes mellitus), type 2 with ophthalmic complications (HCC) 05/16/2009  . Hyperlipidemia 05/16/2009     Prior to Admission medications   Medication Sig Start Date End Date Taking? Authorizing Provider  aspirin 81 MG tablet Take 81 mg by mouth daily.   Yes Historical Provider, MD  fluticasone (FLONASE) 50 MCG/ACT nasal spray Place 2 sprays into both nostrils daily. 06/21/15  Yes Urijah Raynor, PA-C  gemfibrozil (LOPID) 600 MG  tablet TAKE ONE TABLET BY MOUTH TWICE DAILY BEFORE MEAL(S) 07/27/15  Yes Savyon Loken, PA-C  lisinopril (PRINIVIL,ZESTRIL) 10 MG tablet Take 1 tablet (10 mg total) by mouth daily. 11/22/15  Yes Joyice Magda, PA-C  metFORMIN (GLUCOPHAGE) 500 MG tablet TAKE ONE TABLET BY MOUTH ONCE DAILY WITH BREAKFAST 07/27/15  Yes Bennie Scaff, PA-C  pravastatin (PRAVACHOL) 20 MG tablet Take 1 tablet (20 mg total) by mouth daily. 07/27/15  Yes Zaylah Blecha, PA-C  zoster vaccine live, PF, (ZOSTAVAX) [id number] UNT/0.65ML injection Inject 19,400 Units into the skin once. 11/22/15  Yes Jamori Biggar, PA-C     No Known Allergies     Objective:  Physical Exam  Constitutional: He is oriented to person, place, and time. He appears well-developed and well-nourished. He is active and cooperative. No distress.  BP 150/86 mmHg  Pulse 77  Temp(Src) 98.4 F (36.9 C) (Oral)  Resp 16  Ht  (1.702 m)  Wt 183 lb 12.8 oz (83.371 kg)  BMI 28.78 kg/m2  HENT:  Head: Normocephalic and atraumatic.  Right Ear: Hearing normal.  Left Ear: Hearing normal.  Eyes: Conjunctivae are normal. No scleral icterus.  Neck: Normal range of motion. Neck supple. No thyromegaly present.  Cardiovascular: Normal rate, regular rhythm and normal heart sounds.   Pulses:      Radial pulses are 2+ on the right side, and 2+ on the left side.       Dorsalis pedis pulses are 2+ on the right side, and 2+ on the left side.       Posterior tibial pulses are 2+  on the right side, and 2+ on the left side.  Significant varicosities in the lower legs. Non-tender. 1+ dependent edema.  Pulmonary/Chest: Effort normal and breath sounds normal.  Lymphadenopathy:       Head (right side): No tonsillar, no preauricular, no posterior auricular and no occipital adenopathy present.       Head (left side): No tonsillar, no preauricular, no posterior auricular and no occipital adenopathy present.    He has no cervical adenopathy.       Right: No  supraclavicular adenopathy present.       Left: No supraclavicular adenopathy present.  Neurological: He is alert and oriented to person, place, and time. No sensory deficit.  Skin: Skin is warm, dry and intact. No rash noted. No cyanosis or erythema. Nails show no clubbing.  Psychiatric: He has a normal mood and affect. His speech is normal and behavior is normal.           Assessment & Plan:   1. 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 Continue healthy lifestyle changes.  - Hemoglobin A1c - Comprehensive metabolic panel - Microalbumin, urine  2. Hyperlipidemia Await labs. - Lipid panel  3. Essential hypertension, benign Above goal. Increase lisinopril from 10 mg to 20 mg daily.  - Comprehensive metabolic panel - lisinopril (PRINIVIL,ZESTRIL) 20 MG tablet; Take 1 tablet (20 mg total) by mouth daily.  Dispense: 90 tablet; Refill: 3  4. Need for shingles vaccine Encouraged him to get this at his pharmacy. - zoster vaccine live, PF, (ZOSTAVAX) [id number] UNT/0.65ML injection; Inject 19,400 Units into the skin once.  Dispense: 0.65 mL; Refill: 0   Fernande Brashelle S. Damin Salido, PA-C Physician Assistant-Certified [facility]

## 2015-12-05 ENCOUNTER — Encounter: Payer: Self-pay | Admitting: Physician Assistant

## 2016-02-06 ENCOUNTER — Other Ambulatory Visit: Payer: Self-pay | Admitting: Physician Assistant

## 2016-02-11 ENCOUNTER — Other Ambulatory Visit: Payer: Self-pay | Admitting: Physician Assistant

## 2016-02-28 ENCOUNTER — Encounter: Payer: 59 | Admitting: Physician Assistant

## 2016-02-28 NOTE — Progress Notes (Deleted)
   Patient ID: Jerry Zimmerman, male    DOB: 05/31/1941, 75 y.o.   MRN: [id number]  PCP: Porfirio Oarhelle Jeffery, PA-C  No chief complaint on file.   Subjective:   HPI Presents for today for complete physical exam.  Chronic problems addressed today include type 2 diabetes mellitus, hypertension, and hyperlipidemia   ***.    Review of Systems     Patient Active Problem List   Diagnosis Date Noted  . Diabetic retinopathy (HCC) 07/09/2014  . 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 Essential hypertension, benign   . Obesity   . SLEEP APNEA 05/17/2009  . DM (diabetes mellitus), type 2 with ophthalmic complications (HCC) 05/16/2009  . Hyperlipidemia 05/16/2009        No Known Allergies     Objective:  Physical Exam         Assessment & Plan:   ***

## 2016-02-29 ENCOUNTER — Ambulatory Visit (INDEPENDENT_AMBULATORY_CARE_PROVIDER_SITE_OTHER): Payer: 59 | Admitting: Family Medicine

## 2016-02-29 VITALS — BP 132/80 | HR 65 | Temp 98.4°F | Resp 17 | Ht 65.5 in | Wt 181.0 lb

## 2016-02-29 DIAGNOSIS — R030 Elevated blood-pressure reading, without diagnosis of hypertension: Secondary | ICD-10-CM | POA: Diagnosis not present

## 2016-02-29 DIAGNOSIS — IMO0001 Reserved for inherently not codable concepts without codable children: Secondary | ICD-10-CM

## 2016-02-29 DIAGNOSIS — Z0000 Encounter for general adult medical examination without abnormal findings: Secondary | ICD-10-CM | POA: Diagnosis not present

## 2016-02-29 DIAGNOSIS — E1141 Type 2 diabetes mellitus with diabetic mononeuropathy: Secondary | ICD-10-CM | POA: Diagnosis not present

## 2016-02-29 DIAGNOSIS — E785 Hyperlipidemia, unspecified: Secondary | ICD-10-CM

## 2016-02-29 DIAGNOSIS — E113291 Type 2 diabetes mellitus with mild nonproliferative diabetic retinopathy without macular edema, right eye: Secondary | ICD-10-CM

## 2016-02-29 DIAGNOSIS — I10 Essential (primary) hypertension: Secondary | ICD-10-CM

## 2016-02-29 DIAGNOSIS — I8392 Asymptomatic varicose veins of left lower extremity: Secondary | ICD-10-CM

## 2016-02-29 DIAGNOSIS — K625 Hemorrhage of anus and rectum: Secondary | ICD-10-CM | POA: Insufficient documentation

## 2016-02-29 DIAGNOSIS — R5383 Other fatigue: Secondary | ICD-10-CM | POA: Diagnosis not present

## 2016-02-29 DIAGNOSIS — G629 Polyneuropathy, unspecified: Secondary | ICD-10-CM | POA: Insufficient documentation

## 2016-02-29 LAB — POCT CBC
Granulocyte percent: 47.1 %G (ref 37–80)
HCT, POC: 40.7 % — AB (ref 43.5–53.7)
Hemoglobin: 14.5 g/dL (ref 14.1–18.1)
LYMPH, POC: 1.8 (ref 0.6–3.4)
MCH: 32.6 pg — AB (ref 27–31.2)
MCHC: 35.6 g/dL — AB (ref 31.8–35.4)
MCV: 91.5 fL (ref 80–97)
MID (CBC): 0.3 (ref 0–0.9)
MPV: 7.5 fL (ref 0–99.8)
POC Granulocyte: 1.9 — AB (ref 2–6.9)
POC LYMPH %: 44.7 % (ref 10–50)
POC MID %: 8.2 % (ref 0–12)
Platelet Count, POC: 218 10*3/uL (ref 142–424)
RBC: 4.44 M/uL — AB (ref 4.69–6.13)
RDW, POC: 12.5 %
WBC: 4 10*3/uL — AB (ref 4.6–10.2)

## 2016-02-29 LAB — POC MICROSCOPIC URINALYSIS (UMFC): Mucus: ABSENT

## 2016-02-29 LAB — POCT URINALYSIS DIP (MANUAL ENTRY)
BILIRUBIN UA: NEGATIVE
Bilirubin, UA: NEGATIVE
Blood, UA: NEGATIVE
Glucose, UA: NEGATIVE
LEUKOCYTES UA: NEGATIVE
Nitrite, UA: NEGATIVE
PROTEIN UA: NEGATIVE
Spec Grav, UA: 1.02
UROBILINOGEN UA: 0.2
pH, UA: 5.5

## 2016-02-29 LAB — LIPID PANEL
CHOL/HDL RATIO: 6.1 ratio — AB (ref <=5.0)
CHOLESTEROL: 206 mg/dL — AB (ref 125–200)
HDL: 34 mg/dL — AB (ref >=40)
LDL Cholesterol: 113 mg/dL (ref <130)
TRIGLYCERIDES: 294 mg/dL — AB (ref <150)
VLDL: 59 mg/dL — ABNORMAL HIGH (ref <30)

## 2016-02-29 LAB — COMPREHENSIVE METABOLIC PANEL
ALBUMIN: 3.8 g/dL (ref 3.6–5.1)
ALK PHOS: 52 U/L (ref 40–115)
ALT: 11 U/L (ref 9–46)
AST: 16 U/L (ref 10–35)
BUN: 11 mg/dL (ref 7–25)
CALCIUM: 8.6 mg/dL (ref 8.6–10.3)
CO2: 20 mmol/L (ref 20–31)
Chloride: 104 mmol/L (ref 98–110)
Creat: 0.88 mg/dL (ref 0.70–1.18)
Glucose, Bld: 100 mg/dL — ABNORMAL HIGH (ref 65–99)
POTASSIUM: 4.1 mmol/L (ref 3.5–5.3)
Sodium: 138 mmol/L (ref 135–146)
TOTAL PROTEIN: 7.2 g/dL (ref 6.1–8.1)
Total Bilirubin: 0.8 mg/dL (ref 0.2–1.2)

## 2016-02-29 LAB — TSH: TSH: 1.14 m[IU]/L (ref 0.40–4.50)

## 2016-02-29 LAB — HEMOGLOBIN A1C
Hgb A1c MFr Bld: 6.5 % — ABNORMAL HIGH (ref <5.7)
Mean Plasma Glucose: 140 mg/dL

## 2016-02-29 MED ORDER — HYDROCORTISONE ACETATE 25 MG RE SUPP: 25.0000 mg | Freq: Two times a day (BID) | RECTAL | Status: DC

## 2016-02-29 NOTE — Progress Notes (Signed)
Patient ID: Jerry Zimmerman, male    DOB: August 03, 1941, 75 y.o.   MRN: [id number]  PCP: Harrison Mons, PA-C  Chief Complaint  Patient presents with  . Employment Physical    dot     Subjective:   HPI Patient in today for annual physical exam.  Chronic problems include Type 2 Diabetes with retinopathy, diabetic foot   neuropathy, hyperlipidemia, hypertension, and varicose veins.  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  He reports feeling tired and laying around on his days off.  At present, he has no plans of retiring from work. He is starting to increase physical activity in order to improve overall health.  Type 2 Diabetes  He does not monitor his blood glucose at home.  Reports decreasing intake of breads, sugar, and overall starchy foods. He reports noticing some increase in burning and stinging pain in bottom of his feet. Interested in podiatry. He also has noticed his vision has diminished. Recently seen Dr. Alois Cliche, opthalmology for treatment/evaluation of retinopathy.    Hypertension He reports on awakening feeling dizziness when rising from lying, sitting, to standing position.  Postural dizziness is relieved by sitting on the side of bed and dangling legs.  He does not monitor home blood pressure  Reports taking his lisinopril daily and tolerating well.   Hyperlipidemia  He is making efforts to reduce starchy foods and incorporate more activity into life. He is currently prescribed and taking gemfibrozil 600 mg and pravastatin 20 mg.  Rectal bleeding  Rectal bleeding Patient reports intermittent rectal bleeding with bowel movements occasional.  He reports seeing a light red trace of blood in his stools with some defecations.  He is uncertain as to whether or not he has hemorrhoids. Last colonoscopy in 2014. Reports regular daily bowel patterns. Reports consistent water intake.  . Social  History   Social History  . Marital Status: Married    Spouse Name: N/A  . Number of Children: 4  . Years of Education: 14   Occupational History  . 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 Smoking status: Former Smoker -- 3.00 packs/day for 18 years    Types: Cigarettes    Quit date: 08/13/1987  . Smokeless tobacco: Never Used  . Alcohol Use: No  . Drug Use: No  . Sexual Activity:    Partners: Female   Other Topics Concern  . Not on file   Social History Narrative   Originally from [place], one of 12 siblings.   Advanced diploma in Programmer, multimedia in [place].   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 Family History  Problem Relation Age of Onset  . Colon cancer Neg Hx   . Hyperlipidemia Father    Review of Systems     Patient Active Problem List   Diagnosis Date Noted  . Rectal bleeding 02/29/2016  . Diabetic retinopathy ([place]) 07/09/2014  . 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 Essential hypertension, benign   . Obesity   . SLEEP APNEA 05/17/2009  . DM (diabetes mellitus), type 2 with ophthalmic complications ([place]) 54/27/0623  . Hyperlipidemia 05/16/2009     Prior to Admission medications   Medication  Sig Start Date End Date Taking? Authorizing Provider  aspirin 81 MG tablet Take 81 mg by mouth daily.   Yes Historical Provider, MD  fluticasone (FLONASE) 50 MCG/ACT nasal spray Place 2 sprays into both nostrils daily. 06/21/15  Yes [name] Jeffery, PA-C  gemfibrozil (LOPID) 600 MG tablet TAKE ONE TABLET BY MOUTH TWICE DAILY BEFORE MEAL(S) 07/27/15  Yes [name] Jeffery, PA-C  lisinopril (PRINIVIL,ZESTRIL) 20 MG tablet Take 1 tablet (20 mg total) by mouth daily. 11/22/15  Yes [name] Jeffery, PA-C  metFORMIN (GLUCOPHAGE) 500 MG tablet TAKE ONE TABLET BY MOUTH ONCE DAILY WITH BREAKFAST 07/27/15  Yes  [name] Jeffery, PA-C  zoster vaccine live, PF, (ZOSTAVAX) 29980 UNT/0.65ML injection Inject 19,400 Units into the skin once. 11/22/15  Yes [name] Jeffery, PA-C  pravastatin (PRAVACHOL) 20 MG tablet Take 1 tablet (20 mg total) by mouth daily. Patient not taking: Reported on 02/29/2016 07/27/15   Harrison Mons, PA-C     No Known Allergies     Objective:  Physical Exam  Constitutional: He is oriented to person, place, and time. He appears well-developed and well-nourished.  HENT:  Head: Normocephalic.  Right Ear: External ear normal.  Left Ear: External ear normal.  Eyes: Conjunctivae and EOM are normal. Pupils are equal, round, and reactive to light.  Neck: Normal range of motion.  Cardiovascular: Normal rate, regular rhythm, normal heart sounds and intact distal pulses.   12 lead EKG showed frequent multi-form ectopic ventricular beats.  Sinus Rhythm.    Genitourinary:  Rectal exam offered and patient refused.   Musculoskeletal: He exhibits edema.  Mild lower extremity edema present.  Neurological: He is alert and oriented to person, place, and time. He has normal reflexes.     Skin: Skin is warm and dry.           Assessment & Plan:  Type 2 Diabetes  Hemoglobin a1c and Comprehensive Metabolic panel ordered Stable. Continue Metformin 500 mg, daily. Recheck A1C in three months.  Hypertension Comprehensive metabolic panel  Blood pressure stable today. Continue Lisinopril 20 mg daily.   Hyperlipidemia  Lipid panel -pending Comprehensive metabolic panel-pending Lipids have remained relatively stable.  Continue gemfibrozil 600 mg and pravastatin 20 mg dalily. Continue to recheck lipids in 6 months.  Rectal bleeding  Hemorrhoids vs GI bleeding.  More than likely hemorrhoids. Last colonoscopy 2014, indicated the presence of internal hemorrhoids. Hemoglobin and hematocrit stable today.  Patient is not interest in a rectal exam on today.  Ordered a home hemoccult kit to  confirm the presence of blood. Insert rectally, Hydrocortisone 25 mg suppository, twice daily as needed for hemorrhoids. Follow-up if problem persists.   Carroll Sage. Kenton Kingfisher, MSN, FNP-C [facility]

## 2016-02-29 NOTE — Progress Notes (Signed)
This encounter was created in error - please disregard.

## 2016-02-29 NOTE — Patient Instructions (Signed)

## 2016-03-01 LAB — PSA: PSA: 1.16 ng/mL (ref <=4.00)

## 2016-03-02 ENCOUNTER — Encounter: Payer: Self-pay | Admitting: Family Medicine

## 2016-03-05 ENCOUNTER — Other Ambulatory Visit: Payer: Self-pay

## 2016-03-05 ENCOUNTER — Telehealth: Payer: Self-pay

## 2016-03-05 DIAGNOSIS — K625 Hemorrhage of anus and rectum: Secondary | ICD-10-CM

## 2016-03-05 LAB — POC HEMOCCULT BLD/STL (HOME/3-CARD/SCREEN)
Card #2 Fecal Occult Blod, POC: NEGATIVE
FECAL OCCULT BLD: NEGATIVE
FECAL OCCULT BLD: NEGATIVE

## 2016-03-05 NOTE — Telephone Encounter (Signed)
Patient dropped off a form that needs to be faxed and picked up. The form has been placed in [name]'s box. Please call when ready! [phone]

## 2016-03-06 ENCOUNTER — Encounter: Payer: Self-pay | Admitting: Family Medicine

## 2016-03-06 NOTE — Telephone Encounter (Signed)
Biometric Measures & Physical Confirmation form. Completed with information from annual exam on 02/29/2016. However, need to know his waist circumference.

## 2016-03-07 NOTE — Telephone Encounter (Signed)
[name] HAS COMPLETED PATIENT'S PHYSICAL FORM. I HAVE FAXED IT BACK TO HIS COMPANY. I LEFT A MESSAGE ON HIS ANSWERING MACHINE THAT A COPY OF HIS FORM WAS READY FOR HIM TO PICK-UP AND THAT IT WILL BE IN OUR PICK-UP DRAWER IN THE FRONT  OFFICE.  MBC

## 2016-03-27 ENCOUNTER — Ambulatory Visit (INDEPENDENT_AMBULATORY_CARE_PROVIDER_SITE_OTHER): Payer: 59 | Admitting: Physician Assistant

## 2016-03-27 ENCOUNTER — Ambulatory Visit (INDEPENDENT_AMBULATORY_CARE_PROVIDER_SITE_OTHER): Payer: 59

## 2016-03-27 VITALS — BP 130/80 | HR 90 | Temp 99.7°F | Resp 18 | Ht 65.5 in | Wt 180.0 lb

## 2016-03-27 DIAGNOSIS — R059 Cough, unspecified: Secondary | ICD-10-CM

## 2016-03-27 DIAGNOSIS — E1169 Type 2 diabetes mellitus with other specified complication: Secondary | ICD-10-CM | POA: Diagnosis not present

## 2016-03-27 DIAGNOSIS — R05 Cough: Secondary | ICD-10-CM

## 2016-03-27 LAB — POCT CBC
Granulocyte percent: 71.4 % (ref 37–80)
HCT, POC: 42.1 % — AB (ref 43.5–53.7)
Hemoglobin: 14.8 g/dL (ref 14.1–18.1)
Lymph, poc: 1.8 (ref 0.6–3.4)
MCH, POC: 32.4 pg — AB (ref 27–31.2)
MCHC: 35.1 g/dL (ref 31.8–35.4)
MCV: 92.3 fL (ref 80–97)
MID (cbc): 0.5 (ref 0–0.9)
MPV: 7.2 fL (ref 0–99.8)
POC Granulocyte: 5.7 (ref 2–6.9)
POC LYMPH PERCENT: 22.5 % (ref 10–50)
POC MID %: 6.1 %M (ref 0–12)
Platelet Count, POC: 225 10*3/uL (ref 142–424)
RBC: 4.56 M/uL — AB (ref 4.69–6.13)
RDW, POC: 12.3 %
WBC: 8 10*3/uL (ref 4.6–10.2)

## 2016-03-27 MED ORDER — DOXYCYCLINE HYCLATE 100 MG PO CAPS: 100.0000 mg | ORAL_CAPSULE | Freq: Two times a day (BID) | ORAL | 0 refills | Status: DC

## 2016-03-27 NOTE — Progress Notes (Signed)
Jerry Zimmerman  MRN: [id number] DOB: 11/07/1940  PCP: Jerry Oarhelle Jeffery, PA-C  Subjective:  Pt is a 75 year old man with a history of DM and HTN, presenting to clinic for cough and sore throat x7 days. His cough has progressed to watery eyes, fever (subjective) and muscle and joint aches. His cough produces a whitish sputum.  No chest pain, shortness of breath, difficulty with deep inspiration.  Has tried vicks vap-o-rub and Advil yesterday, some relief.  He has never had this before. Says it is getting worse, which is why he presents to the clinic this morning.   Former smoker: 2ppd x 18 years. Quit in 1998.   Review of Systems  Constitutional: Positive for appetite change (decreased appetite) and fever. Negative for chills, diaphoresis and fatigue.  HENT: Positive for congestion and sore throat.   Respiratory: Positive for cough (productive, white sputum). Negative for chest tightness, shortness of breath and wheezing.   Cardiovascular: Negative.   Genitourinary: Negative for difficulty urinating, dysuria, flank pain and urgency.  Musculoskeletal: Positive for arthralgias and myalgias. Negative for joint swelling and neck stiffness.  Skin: Negative.  Negative for rash.  Neurological: Negative for dizziness, tremors, syncope, weakness, light-headedness and headaches.    Patient Active Problem List   Diagnosis Date Noted  . Rectal bleeding 02/29/2016  . Neuropathy (HCC) 02/29/2016  . Diabetic retinopathy (HCC) 07/09/2014  . 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 Essential hypertension, benign   . Obesity   . SLEEP APNEA 05/17/2009  . DM (diabetes mellitus), type 2 with ophthalmic complications (HCC) 05/16/2009  . Hyperlipidemia 05/16/2009    Current Outpatient Prescriptions on File Prior to Visit  Medication Sig Dispense Refill  . aspirin 81 MG tablet Take 81 mg by mouth daily.    Marland Kitchen. gemfibrozil (LOPID) 600 MG tablet TAKE ONE TABLET BY MOUTH TWICE DAILY BEFORE  MEAL(S) 180 tablet 3  . hydrocortisone (ANUSOL-HC) 25 MG suppository Place 1 suppository (25 mg total) rectally 2 (two) times daily. 12 suppository 1  . lisinopril (PRINIVIL,ZESTRIL) 20 MG tablet Take 1 tablet (20 mg total) by mouth daily. 90 tablet 3  . metFORMIN (GLUCOPHAGE) 500 MG tablet TAKE ONE TABLET BY MOUTH ONCE DAILY WITH BREAKFAST 90 tablet 3  . pravastatin (PRAVACHOL) 20 MG tablet Take 1 tablet (20 mg total) by mouth daily. (Patient not taking: Reported on 03/27/2016) 90 tablet 3   No current facility-administered medications on file prior to visit.     No Known Allergies  Objective:  BP 130/80 (BP Location: Right Arm, Patient Position: Sitting, Cuff Size: Small)   Pulse 90   Temp 99.7 F (37.6 C) (Oral)   Resp 18   Ht 5' 5.5" (1.664 m)   Wt 180 lb (81.6 kg)   SpO2 97%   BMI 29.50 kg/m   Physical Exam  Constitutional: He is oriented to person, place, and time and well-developed, well-nourished, and in no distress. No distress.  Cardiovascular: Normal rate, regular rhythm and normal heart sounds.  Exam reveals no friction rub.   No murmur heard. Pulmonary/Chest: Effort normal. No accessory muscle usage. No respiratory distress. He has no decreased breath sounds. He has no wheezes. He has rhonchi in the left lower field. He has rales in the left lower field.      Neurological: He is alert and oriented to person, place, and time. Gait normal. GCS score is 15.  Skin: Skin is warm and dry. He is not diaphoretic.  Psychiatric: Mood, memory, affect and  judgment normal.  Vitals reviewed.  Results for orders placed or performed in visit on 03/27/16  POCT CBC  Result Value Ref Range   WBC 8.0 4.6 - 10.2 K/uL   Lymph, poc 1.8 0.6 - 3.4   POC LYMPH PERCENT 22.5 10 - 50 %L   MID (cbc) 0.5 0 - 0.9   POC MID % 6.1 0 - 12 %M   POC Granulocyte 5.7 2 - 6.9   Granulocyte percent 71.4 37 - 80 %G   RBC 4.56 (A) 4.69 - 6.13 M/uL   Hemoglobin 14.8 14.1 - 18.1 g/dL   HCT, POC 47.842.1  (A) 29.543.5 - 53.7 %   MCV 92.3 80 - 97 fL   MCH, POC 32.4 (A) 27 - 31.2 pg   MCHC 35.1 31.8 - 35.4 g/dL   RDW, POC 62.112.3 %   Platelet Count, POC 225 142 - 424 K/uL   MPV 7.2 0 - 99.8 fL   Dg Chest 2 View  Result Date: 03/27/2016 CLINICAL DATA:  Cough, fever for 7 days EXAM: CHEST  2 VIEW COMPARISON:  Chest x-ray of 06/21/2015 FINDINGS: No active infiltrate or effusion is seen. Mediastinal and hilar contours are unremarkable. The heart is within normal limits in size No bony abnormality is seen. IMPRESSION: No active cardiopulmonary disease. Electronically Signed   By: Dwyane DeePaul  Barry M.D.   On: 03/27/2016 09:58    Assessment and Plan :  1. Cough - DG Chest 2 View; Future - POCT CBC - doxycycline (VIBRAMYCIN) 100 MG capsule; Take 1 capsule (100 mg total) by mouth 2 (two) times daily.  Dispense: 20 capsule; Refill: 0  2. Type 2 diabetes mellitus with other specified complication [facility](HCC)   - Mr. Jerry Zimmerman likely has a viral infection. His chest x-ray is clear and his labs show no increase in WBC, however on physical exam consolidation is heard in his LLF and his history suggests worsening symptoms.  In order to curb progression to a bacterial infection, treatment is initiated with Doxycycline due to his age and comorbidities. This was discussed with patient, he understands and agrees.    Jerry CollieWhitney Aretha Levi, PA-C  [facility] 03/27/2016 9:56 AM

## 2016-03-27 NOTE — Patient Instructions (Addendum)
Drink plenty of fluids. Please stay hydrated.  Take the entire course of antibiotics as directed until the course is completed.  Return to clinic should your symptoms worsen.    Thank you for coming in today. I hope you feel we met your needs.  Feel free to call [facility] if you have any questions or further requests.  Please consider signing up for MyChart if you do not already have it, as this is a great way to communicate with me.  Best,  Whitney McVey, PA-C    IF you received an x-ray today, you will receive an invoice from [facility]. Please contact [facility] at [phone] with questions or concerns regarding your invoice.   IF you received labwork today, you will receive an invoice from [employer]. Please contact [facility] at [phone] with questions or concerns regarding your invoice.   Our billing staff will not be able to assist you with questions regarding bills from these companies.  You will be contacted with the lab results as soon as they are available. The fastest way to get your results is to activate your My Chart account. Instructions are located on the last page of this paperwork. If you have not heard from [place] regarding the results in 2 weeks, please contact this office.

## 2016-07-17 ENCOUNTER — Encounter: Payer: Self-pay | Admitting: Physician Assistant

## 2016-07-17 ENCOUNTER — Ambulatory Visit (INDEPENDENT_AMBULATORY_CARE_PROVIDER_SITE_OTHER): Payer: 59 | Admitting: Physician Assistant

## 2016-07-17 ENCOUNTER — Ambulatory Visit (INDEPENDENT_AMBULATORY_CARE_PROVIDER_SITE_OTHER): Payer: 59

## 2016-07-17 VITALS — BP 160/84 | HR 75 | Temp 98.1°F | Resp 16 | Wt 185.0 lb

## 2016-07-17 DIAGNOSIS — R0989 Other specified symptoms and signs involving the circulatory and respiratory systems: Secondary | ICD-10-CM

## 2016-07-17 DIAGNOSIS — I10 Essential (primary) hypertension: Secondary | ICD-10-CM | POA: Diagnosis not present

## 2016-07-17 DIAGNOSIS — E113291 Type 2 diabetes mellitus with mild nonproliferative diabetic retinopathy without macular edema, right eye: Secondary | ICD-10-CM

## 2016-07-17 DIAGNOSIS — L309 Dermatitis, unspecified: Secondary | ICD-10-CM

## 2016-07-17 DIAGNOSIS — E785 Hyperlipidemia, unspecified: Secondary | ICD-10-CM | POA: Diagnosis not present

## 2016-07-17 MED ORDER — GEMFIBROZIL 600 MG PO TABS: ORAL_TABLET | ORAL | 3 refills | Status: DC

## 2016-07-17 MED ORDER — ASPIRIN 81 MG PO TABS: 81.0000 mg | ORAL_TABLET | Freq: Every day | ORAL | 3 refills | Status: DC

## 2016-07-17 MED ORDER — LISINOPRIL 20 MG PO TABS: 20.0000 mg | ORAL_TABLET | Freq: Every day | ORAL | 3 refills | Status: DC

## 2016-07-17 MED ORDER — TRIAMCINOLONE ACETONIDE 0.025 % EX OINT: 1.0000 "application " | TOPICAL_OINTMENT | Freq: Two times a day (BID) | CUTANEOUS | 0 refills | Status: DC

## 2016-07-17 MED ORDER — METFORMIN HCL 500 MG PO TABS: ORAL_TABLET | ORAL | 3 refills | Status: DC

## 2016-07-17 NOTE — Progress Notes (Signed)
Patient ID: Estill DoomsMohamed Romberg, male    DOB: 08/25/1940, 75 y.o.   MRN: [id number]  PCP: Porfirio Oarhelle Trisha Ken, PA-C  Chief Complaint  Patient presents with  . Follow-up    BP and diabetes  . Rash    Subjective:   Presents for evaluation of diabetes, HTN and lipids.  A1C 02/29/2016 6.5%, down from >7% for the previous year.  When he's lying down, he hears a sound with breathing that he'd like checked out. CXR 03/2016 for same was normal. He says he was having the same problem then, but that note states that he was experiencing cough and sore throat. Exam was notable for rhonchi in the LLL. He was treated with doxycycline for suspected early bacterial infection.  When he is outside and comes back in, he has sneezing and sometimes a cough. This also happens when he leaves work at the end of the shift. Doesn't last more than 30 minutes.  He is a former smoker. Quit 1998.  No fever, chills. No nausea, vomiting or diarrhea. No new muscle or joint pain. No CP, SOB, HA, dizziness. No polydipsia, polyuria.  He has developed an itchy rash on the LEFT forearm. Requests a cream.     Review of Systems As above.    Patient Active Problem List   Diagnosis Date Noted  . Rectal bleeding 02/29/2016  . Neuropathy (HCC) 02/29/2016  . Diabetic retinopathy (HCC) 07/09/2014  . 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 Essential hypertension, benign   . Obesity   . SLEEP APNEA 05/17/2009  . DM (diabetes mellitus), type 2 with ophthalmic complications (HCC) 05/16/2009  . Hyperlipidemia 05/16/2009     Prior to Admission medications   Medication Sig Start Date End Date Taking? Authorizing Provider  aspirin 81 MG tablet Take 81 mg by mouth daily.   Yes Historical Provider, MD  gemfibrozil (LOPID) 600 MG tablet TAKE ONE TABLET BY MOUTH TWICE DAILY BEFORE MEAL(S) 07/27/15  Yes Seara Hinesley, PA-C  lisinopril (PRINIVIL,ZESTRIL) 20 MG tablet Take 1 tablet (20 mg total) by mouth daily.  11/22/15  Yes Fong Mccarry, PA-C  metFORMIN (GLUCOPHAGE) 500 MG tablet TAKE ONE TABLET BY MOUTH ONCE DAILY WITH BREAKFAST 07/27/15  Yes Fed Ceci, PA-C  doxycycline (VIBRAMYCIN) 100 MG capsule Take 1 capsule (100 mg total) by mouth 2 (two) times daily. 03/27/16   Elizabeth Whitney McVey, PA-C  hydrocortisone (ANUSOL-HC) 25 MG suppository Place 1 suppository (25 mg total) rectally 2 (two) times daily. Patient not taking: Reported on 07/17/2016 02/29/16   Doyle AskewKimberly Stephenia Harris, FNP  pravastatin (PRAVACHOL) 20 MG tablet Take 1 tablet (20 mg total) by mouth daily. Patient not taking: Reported on 07/17/2016 07/27/15   Porfirio Oarhelle Caeleb Batalla, PA-C     No Known Allergies     Objective:  Physical Exam  Constitutional: He is oriented to person, place, and time. He appears well-developed and well-nourished. He is active and cooperative. No distress.  BP (!) 160/84 (BP Location: Left Arm, Cuff Size: Normal)   Pulse 75   Temp 98.1 F (36.7 C)   Resp 16   Wt 185 lb (83.9 kg)   SpO2 98%   BMI 30.32 kg/m   HENT:  Head: Normocephalic and atraumatic.  Right Ear: Hearing normal.  Left Ear: Hearing normal.  Eyes: Conjunctivae are normal. No scleral icterus.  Neck: Normal range of motion. Neck supple. No thyromegaly present.  Cardiovascular: Normal rate, regular rhythm and normal heart sounds.   Pulses:  Radial pulses are 2+ on the right side, and 2+ on the left side.  No LE edema  Pulmonary/Chest: Effort normal. He has no decreased breath sounds. He has wheezes in the left lower field. He has no rhonchi. He has no rales.  High-pitched end-expiratory wheeze heard in the LEFT base, heard only when patient is supine.  Lymphadenopathy:       Head (right side): No tonsillar, no preauricular, no posterior auricular and no occipital adenopathy present.       Head (left side): No tonsillar, no preauricular, no posterior auricular and no occipital adenopathy present.    He has no cervical adenopathy.         Right: No supraclavicular adenopathy present.       Left: No supraclavicular adenopathy present.  Neurological: He is alert and oriented to person, place, and time. No sensory deficit.  Skin: Skin is warm, dry and intact. Rash noted. No cyanosis or erythema. Nails show no clubbing.     Psychiatric: He has a normal mood and affect. His speech is normal and behavior is normal.    After examination, patient was heard coughing in the exam room.       Assessment & Plan:   1. 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 Await lab results. - metFORMIN (GLUCOPHAGE) 500 MG tablet; TAKE ONE TABLET BY MOUTH ONCE DAILY WITH BREAKFAST  Dispense: 90 tablet; Refill: 3 - Hemoglobin A1c - Comprehensive metabolic panel - Microalbumin, urine  2. Essential hypertension, benign Above goal today. Await lab results. Recheck next visit. - lisinopril (PRINIVIL,ZESTRIL) 20 MG tablet; Take 1 tablet (20 mg total) by mouth daily.  Dispense: 90 tablet; Refill: 3 - aspirin 81 MG tablet; Take 1 tablet (81 mg total) by mouth daily.  Dispense: 90 tablet; Refill: 3 - CBC with Differential/Platelet - Comprehensive metabolic panel  3. Hyperlipidemia, unspecified hyperlipidemia type Await labs. Adjust regimen as indicated by results. Consider re-addition of low-dose statin (was on pravastatin 20 mg) if LDL remains elevated. - gemfibrozil (LOPID) 600 MG tablet; TAKE ONE TABLET BY MOUTH TWICE DAILY BEFORE MEAL(S)  Dispense: 180 tablet; Refill: 3 - Comprehensive metabolic panel - Lipid panel  4. Abnormal lung sounds Await films. Consider albuterol inhaler, steroid inhaler and/or pulmonology evaluation.  - DG Chest 2 View; Future  5. Dermatitis Reactive. - triamcinolone (KENALOG) 0.025 % ointment; Apply 1 application topically 2 (two) times daily.  Dispense: 30 g; Refill:  0   Fernande Brashelle S. Adesuwa Osgood, PA-C Physician Assistant-Certified [facility]

## 2016-07-17 NOTE — Patient Instructions (Addendum)
Make sure you see a dentist every 6 months, an eye specialist every year, and check your feet for sores every day.    IF you received an x-ray today, you will receive an invoice from [facility]. Please contact [facility] at [phone] with questions or concerns regarding your invoice.   IF you received labwork today, you will receive an invoice from [facility]. Please contact [facility] at [phone] with questions or concerns regarding your invoice.   Our billing staff will not be able to assist you with questions regarding bills from these companies.  You will be contacted with the lab results as soon as they are available. The fastest way to get your results is to activate your My Chart account. Instructions are located on the last page of this paperwork. If you have not heard from us regarding the results in 2 weeks, please contact this office.

## 2016-07-18 LAB — COMPREHENSIVE METABOLIC PANEL
ALBUMIN: 3.9 g/dL (ref 3.5–4.8)
ALT: 14 IU/L (ref 0–44)
AST: 19 IU/L (ref 0–40)
Albumin/Globulin Ratio: 1.1 — ABNORMAL LOW (ref 1.2–2.2)
Alkaline Phosphatase: 73 IU/L (ref 39–117)
BILIRUBIN TOTAL: 0.8 mg/dL (ref 0.0–1.2)
BUN / CREAT RATIO: 18 (ref 10–24)
BUN: 14 mg/dL (ref 8–27)
CALCIUM: 9.3 mg/dL (ref 8.6–10.2)
CHLORIDE: 97 mmol/L (ref 96–106)
CO2: 26 mmol/L (ref 18–29)
Creatinine, Ser: 0.8 mg/dL (ref 0.76–1.27)
GFR, EST AFRICAN AMERICAN: 101 mL/min/{1.73_m2} (ref >59)
GFR, EST NON AFRICAN AMERICAN: 87 mL/min/{1.73_m2} (ref >59)
GLOBULIN, TOTAL: 3.5 g/dL (ref 1.5–4.5)
Glucose: 109 mg/dL — ABNORMAL HIGH (ref 65–99)
POTASSIUM: 4.3 mmol/L (ref 3.5–5.2)
SODIUM: 138 mmol/L (ref 134–144)
TOTAL PROTEIN: 7.4 g/dL (ref 6.0–8.5)

## 2016-07-18 LAB — CBC WITH DIFFERENTIAL/PLATELET
Basophils Absolute: 0 10*3/uL (ref 0.0–0.2)
Basos: 0 %
EOS (ABSOLUTE): 0.5 10*3/uL — ABNORMAL HIGH (ref 0.0–0.4)
EOS: 10 %
HEMATOCRIT: 41.7 % (ref 37.5–51.0)
HEMOGLOBIN: 14.9 g/dL (ref 13.0–17.7)
Immature Grans (Abs): 0 10*3/uL (ref 0.0–0.1)
Immature Granulocytes: 0 %
LYMPHS ABS: 2.1 10*3/uL (ref 0.7–3.1)
Lymphs: 44 %
MCH: 33.1 pg — AB (ref 26.6–33.0)
MCHC: 35.7 g/dL (ref 31.5–35.7)
MCV: 93 fL (ref 79–97)
MONOCYTES: 11 %
MONOS ABS: 0.5 10*3/uL (ref 0.1–0.9)
NEUTROS ABS: 1.7 10*3/uL (ref 1.4–7.0)
Neutrophils: 35 %
Platelets: 255 10*3/uL (ref 150–379)
RBC: 4.5 x10E6/uL (ref 4.14–5.80)
RDW: 13.3 % (ref 12.3–15.4)
WBC: 4.8 10*3/uL (ref 3.4–10.8)

## 2016-07-18 LAB — LIPID PANEL
CHOL/HDL RATIO: 7 ratio — AB (ref 0.0–5.0)
Cholesterol, Total: 246 mg/dL — ABNORMAL HIGH (ref 100–199)
HDL: 35 mg/dL — ABNORMAL LOW (ref >39)
TRIGLYCERIDES: 435 mg/dL — AB (ref 0–149)

## 2016-07-18 LAB — HEMOGLOBIN A1C
Est. average glucose Bld gHb Est-mCnc: 148 mg/dL
Hgb A1c MFr Bld: 6.8 % — ABNORMAL HIGH (ref 4.8–5.6)

## 2016-07-18 LAB — MICROALBUMIN, URINE: Microalbumin, Urine: 6.4 ug/mL

## 2016-07-25 ENCOUNTER — Other Ambulatory Visit: Payer: Self-pay | Admitting: Physician Assistant

## 2016-07-25 MED ORDER — ALBUTEROL SULFATE HFA 108 (90 BASE) MCG/ACT IN AERS: 2.0000 | INHALATION_SPRAY | RESPIRATORY_TRACT | 1 refills | Status: DC | PRN

## 2016-07-26 LAB — HM DIABETES EYE EXAM

## 2016-09-20 ENCOUNTER — Telehealth: Payer: Self-pay

## 2016-09-20 NOTE — Telephone Encounter (Signed)
LVM for pt to call back and reschedule his 10/23/16 appt with [name] due to her being out of the office. Anyone can make him another appointment on her next available day. Thank you!

## 2016-10-23 ENCOUNTER — Ambulatory Visit: Payer: 59 | Admitting: Physician Assistant

## 2016-11-06 ENCOUNTER — Encounter: Payer: Self-pay | Admitting: Physician Assistant

## 2016-11-06 ENCOUNTER — Ambulatory Visit (INDEPENDENT_AMBULATORY_CARE_PROVIDER_SITE_OTHER): Payer: 59 | Admitting: Physician Assistant

## 2016-11-06 VITALS — BP 182/90 | HR 76 | Temp 97.6°F | Resp 16 | Ht 65.0 in | Wt 186.0 lb

## 2016-11-06 DIAGNOSIS — I10 Essential (primary) hypertension: Secondary | ICD-10-CM | POA: Diagnosis not present

## 2016-11-06 DIAGNOSIS — E113291 Type 2 diabetes mellitus with mild nonproliferative diabetic retinopathy without macular edema, right eye: Secondary | ICD-10-CM | POA: Diagnosis not present

## 2016-11-06 DIAGNOSIS — E785 Hyperlipidemia, unspecified: Secondary | ICD-10-CM | POA: Diagnosis not present

## 2016-11-06 NOTE — Progress Notes (Signed)
Patient ID: Jerry Zimmerman, male    DOB: 08/07/1941, 76 y.o.   MRN: [id number]  PCP: Porfirio Oarhelle Pier Laux, PA-C  Chief Complaint  Patient presents with  . Follow-up    DM check/ lab work    Subjective:   Presents for evaluation of diabetes, HTN and lipids.  Feeling good.  Not taking pravastatin. Doesn't have any. Isn't sure why he stopped it. Suspect that he stopped it when he started the gemfibrozil.  Doesn't take the lisinopril at a specific time each day. Hasn't taken it this morning.  Recall that he has a long history of medication non-compliance, partly due to language barrier and partly due to not wanting to take medications for things that don't make him feel bad.  Never filled Rx for albuterol. Was concerned that it could become habit forming, or that by using it, he would become dependent on it.  Has become worried about cancer. Several friends and acquaintances recently diagnosed with advanced cancers. He doesn't know what kind.    Review of Systems Denies chest pain, shortness of breath, HA, dizziness, vision change, nausea, vomiting, diarrhea, constipation, melena, hematochezia, dysuria, increased urinary urgency or frequency, increased hunger or thirst, unintentional weight change, unexplained myalgias or arthralgias, rash.     Patient Active Problem List   Diagnosis Date Noted  . Rectal bleeding 02/29/2016  . Neuropathy (HCC) 02/29/2016  . Diabetic retinopathy (HCC) 07/09/2014  . 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 Essential hypertension, benign   . Obesity   . SLEEP APNEA 05/17/2009  . DM (diabetes mellitus), type 2 with ophthalmic complications (HCC) 05/16/2009  . Hyperlipidemia 05/16/2009     Prior to Admission medications   Medication Sig Start Date End Date Taking? Authorizing Provider  gemfibrozil (LOPID) 600 MG tablet TAKE ONE TABLET BY MOUTH TWICE DAILY BEFORE MEAL(S) 07/17/16  Yes Ashok Sawaya, PA-C  lisinopril  (PRINIVIL,ZESTRIL) 20 MG tablet Take 1 tablet (20 mg total) by mouth daily. 07/17/16  Yes Nicholette Dolson, PA-C  metFORMIN (GLUCOPHAGE) 500 MG tablet TAKE ONE TABLET BY MOUTH ONCE DAILY WITH BREAKFAST 07/17/16  Yes Keishia Ground, PA-C  albuterol (PROVENTIL HFA;VENTOLIN HFA) 108 (90 Base) MCG/ACT inhaler Inhale 2 puffs into the lungs every 4 (four) hours as needed for wheezing or shortness of breath (cough, shortness of breath or wheezing.). Patient not taking: Reported on 11/06/2016 07/25/16   Porfirio Oarhelle Lyndle Pang, PA-C  aspirin 81 MG tablet Take 1 tablet (81 mg total) by mouth daily. Patient not taking: Reported on 11/06/2016 07/17/16   Porfirio Oarhelle Shandora Koogler, PA-C  hydrocortisone (ANUSOL-HC) 25 MG suppository Place 1 suppository (25 mg total) rectally 2 (two) times daily. Patient not taking: Reported on 07/17/2016 02/29/16   Doyle AskewKimberly Stephenia Harris, FNP  pravastatin (PRAVACHOL) 20 MG tablet Take 1 tablet (20 mg total) by mouth daily. Patient not taking: Reported on 07/17/2016 07/27/15   Porfirio Oarhelle Dayden Viverette, PA-C  triamcinolone (KENALOG) 0.025 % ointment Apply 1 application topically 2 (two) times daily. Patient not taking: Reported on 11/06/2016 07/17/16   Porfirio Oarhelle Nathanie Ottley, PA-C     No Known Allergies     Objective:  Physical Exam  Constitutional: He is oriented to person, place, and time. He appears well-developed and well-nourished. He is active and cooperative. No distress.  BP (!) 182/90   Pulse 76   Temp 97.6 F (36.4 C) (Oral)   Resp 16   Ht 5\' 5"  (1.651 m)   Wt 186 lb (84.4 kg)   SpO2 99%   BMI  30.95 kg/m   HENT:  Head: Normocephalic and atraumatic.  Right Ear: Hearing normal.  Left Ear: Hearing normal.  Eyes: Conjunctivae are normal. No scleral icterus.  Neck: Normal range of motion. Neck supple. No thyromegaly present.  Cardiovascular: Normal rate, regular rhythm and normal heart sounds.   Pulses:      Radial pulses are 2+ on the right side, and 2+ on the left side.  Pulmonary/Chest: Effort  normal and breath sounds normal.  Lymphadenopathy:       Head (right side): No tonsillar, no preauricular, no posterior auricular and no occipital adenopathy present.       Head (left side): No tonsillar, no preauricular, no posterior auricular and no occipital adenopathy present.    He has no cervical adenopathy.       Right: No supraclavicular adenopathy present.       Left: No supraclavicular adenopathy present.  Neurological: He is alert and oriented to person, place, and time. No sensory deficit.  Skin: Skin is warm, dry and intact. No rash noted. No cyanosis or erythema. Nails show no clubbing.  Psychiatric: He has a normal mood and affect. His speech is normal and behavior is normal.       Assessment & Plan:   Problem List Items Addressed This Visit    DM (diabetes mellitus), type 2 with ophthalmic complications (HCC) - Primary    Await labs. Adjust regimen as indicated by results.       Relevant Orders   Comprehensive metabolic panel   Hemoglobin A1c   HM Diabetes Foot Exam (Completed)   Hyperlipidemia    Non-compliant with statin. Await labs. Adjust regimen as indicated by results. Anticipate resuming pravastatin.      Relevant Orders   Comprehensive metabolic panel   Lipid panel   Essential hypertension, benign    Uncontrolled. Not taking lisinopril at the same time every day, and hasn't taken it yet today, but thinks he's taking it regularly. Advised again to take this medication every day, at about the same time each day. Recheck in 4 weeks. If BP remains above goal, will adjust regimen.      Relevant Orders   Comprehensive metabolic panel

## 2016-11-06 NOTE — Assessment & Plan Note (Signed)
Await labs. Adjust regimen as indicated by results.  

## 2016-11-06 NOTE — Assessment & Plan Note (Signed)
Uncontrolled. Not taking lisinopril at the same time every day, and hasn't taken it yet today, but thinks he's taking it regularly. Advised again to take this medication every day, at about the same time each day. Recheck in 4 weeks. If BP remains above goal, will adjust regimen.

## 2016-11-06 NOTE — Assessment & Plan Note (Signed)
Non-compliant with statin. Await labs. Adjust regimen as indicated by results. Anticipate resuming pravastatin.

## 2016-11-06 NOTE — Patient Instructions (Signed)
     IF you received an x-ray today, you will receive an invoice from [facility]. Please contact [facility] at [phone] with questions or concerns regarding your invoice.   IF you received labwork today, you will receive an invoice from [facility]. Please contact [facility] at [phone] with questions or concerns regarding your invoice.   Our billing staff will not be able to assist you with questions regarding bills from these companies.  You will be contacted with the lab results as soon as they are available. The fastest way to get your results is to activate your My Chart account. Instructions are located on the last page of this paperwork. If you have not heard from us regarding the results in 2 weeks, please contact this office.     

## 2016-11-07 LAB — HEMOGLOBIN A1C
ESTIMATED AVERAGE GLUCOSE: 163 mg/dL
Hgb A1c MFr Bld: 7.3 % — ABNORMAL HIGH (ref 4.8–5.6)

## 2016-11-07 LAB — LIPID PANEL
CHOLESTEROL TOTAL: 211 mg/dL — AB (ref 100–199)
Chol/HDL Ratio: 6 ratio units — ABNORMAL HIGH (ref 0.0–5.0)
HDL: 35 mg/dL — ABNORMAL LOW (ref >39)
LDL Calculated: 124 mg/dL — ABNORMAL HIGH (ref 0–99)
Triglycerides: 260 mg/dL — ABNORMAL HIGH (ref 0–149)
VLDL CHOLESTEROL CAL: 52 mg/dL — AB (ref 5–40)

## 2016-11-07 LAB — COMPREHENSIVE METABOLIC PANEL
ALBUMIN: 3.9 g/dL (ref 3.5–4.8)
ALK PHOS: 63 IU/L (ref 39–117)
ALT: 15 IU/L (ref 0–44)
AST: 20 IU/L (ref 0–40)
Albumin/Globulin Ratio: 1.1 — ABNORMAL LOW (ref 1.2–2.2)
BILIRUBIN TOTAL: 0.5 mg/dL (ref 0.0–1.2)
BUN / CREAT RATIO: 13 (ref 10–24)
BUN: 13 mg/dL (ref 8–27)
CHLORIDE: 98 mmol/L (ref 96–106)
CO2: 25 mmol/L (ref 18–29)
Calcium: 9 mg/dL (ref 8.6–10.2)
Creatinine, Ser: 1.01 mg/dL (ref 0.76–1.27)
GFR calc Af Amer: 83 mL/min/{1.73_m2} (ref >59)
GFR calc non Af Amer: 72 mL/min/{1.73_m2} (ref >59)
GLUCOSE: 125 mg/dL — AB (ref 65–99)
Globulin, Total: 3.5 g/dL (ref 1.5–4.5)
Potassium: 4.1 mmol/L (ref 3.5–5.2)
Sodium: 139 mmol/L (ref 134–144)
Total Protein: 7.4 g/dL (ref 6.0–8.5)

## 2016-11-16 ENCOUNTER — Encounter: Payer: Self-pay | Admitting: Physician Assistant

## 2016-11-17 ENCOUNTER — Encounter (HOSPITAL_COMMUNITY): Admission: EM | Disposition: A | Discharge status: Rehabilitation Facility | Payer: Self-pay | Source: Home / Self Care

## 2016-11-17 ENCOUNTER — Inpatient Hospital Stay (HOSPITAL_COMMUNITY)
Admission: EM | Admit: 2016-11-17 | Discharge: 2016-11-27 | DRG: 956 | Disposition: A | Discharge status: Rehabilitation Facility | Payer: 59 | Attending: General Surgery | Admitting: General Surgery

## 2016-11-17 ENCOUNTER — Inpatient Hospital Stay (HOSPITAL_COMMUNITY): Payer: 59

## 2016-11-17 ENCOUNTER — Inpatient Hospital Stay (HOSPITAL_COMMUNITY): Payer: 59 | Admitting: Certified Registered Nurse Anesthetist

## 2016-11-17 ENCOUNTER — Emergency Department (HOSPITAL_COMMUNITY): Payer: 59

## 2016-11-17 ENCOUNTER — Other Ambulatory Visit: Payer: Self-pay

## 2016-11-17 ENCOUNTER — Other Ambulatory Visit (HOSPITAL_COMMUNITY): Payer: Self-pay

## 2016-11-17 ENCOUNTER — Encounter (HOSPITAL_COMMUNITY): Payer: Self-pay | Admitting: *Deleted

## 2016-11-17 DIAGNOSIS — I10 Essential (primary) hypertension: Secondary | ICD-10-CM | POA: Diagnosis present

## 2016-11-17 DIAGNOSIS — M25461 Effusion, right knee: Secondary | ICD-10-CM | POA: Diagnosis present

## 2016-11-17 DIAGNOSIS — Z79899 Other long term (current) drug therapy: Secondary | ICD-10-CM

## 2016-11-17 DIAGNOSIS — M549 Dorsalgia, unspecified: Secondary | ICD-10-CM | POA: Diagnosis not present

## 2016-11-17 DIAGNOSIS — S066X2D Traumatic subarachnoid hemorrhage with loss of consciousness of 31 minutes to 59 minutes, subsequent encounter: Secondary | ICD-10-CM | POA: Diagnosis present

## 2016-11-17 DIAGNOSIS — S72401A Unspecified fracture of lower end of right femur, initial encounter for closed fracture: Secondary | ICD-10-CM

## 2016-11-17 DIAGNOSIS — S06349A Traumatic hemorrhage of right cerebrum with loss of consciousness of unspecified duration, initial encounter: Secondary | ICD-10-CM

## 2016-11-17 DIAGNOSIS — Z9889 Other specified postprocedural states: Secondary | ICD-10-CM

## 2016-11-17 DIAGNOSIS — J9691 Respiratory failure, unspecified with hypoxia: Secondary | ICD-10-CM | POA: Diagnosis not present

## 2016-11-17 DIAGNOSIS — D62 Acute posthemorrhagic anemia: Secondary | ICD-10-CM | POA: Diagnosis not present

## 2016-11-17 DIAGNOSIS — Y92413 State road as the place of occurrence of the external cause: Secondary | ICD-10-CM

## 2016-11-17 DIAGNOSIS — R402242 Coma scale, best verbal response, confused conversation, at arrival to emergency department: Secondary | ICD-10-CM | POA: Diagnosis present

## 2016-11-17 DIAGNOSIS — Z7984 Long term (current) use of oral hypoglycemic drugs: Secondary | ICD-10-CM

## 2016-11-17 DIAGNOSIS — Z781 Physical restraint status: Secondary | ICD-10-CM

## 2016-11-17 DIAGNOSIS — S066X9A Traumatic subarachnoid hemorrhage with loss of consciousness of unspecified duration, initial encounter: Secondary | ICD-10-CM | POA: Diagnosis present

## 2016-11-17 DIAGNOSIS — K5900 Constipation, unspecified: Secondary | ICD-10-CM | POA: Diagnosis not present

## 2016-11-17 DIAGNOSIS — R402132 Coma scale, eyes open, to sound, at arrival to emergency department: Secondary | ICD-10-CM | POA: Diagnosis present

## 2016-11-17 DIAGNOSIS — R339 Retention of urine, unspecified: Secondary | ICD-10-CM | POA: Diagnosis not present

## 2016-11-17 DIAGNOSIS — Z8781 Personal history of (healed) traumatic fracture: Secondary | ICD-10-CM

## 2016-11-17 DIAGNOSIS — E119 Type 2 diabetes mellitus without complications: Secondary | ICD-10-CM | POA: Diagnosis present

## 2016-11-17 DIAGNOSIS — S72321A Displaced transverse fracture of shaft of right femur, initial encounter for closed fracture: Secondary | ICD-10-CM

## 2016-11-17 DIAGNOSIS — F09 Unspecified mental disorder due to known physiological condition: Secondary | ICD-10-CM | POA: Diagnosis not present

## 2016-11-17 DIAGNOSIS — Z8679 Personal history of other diseases of the circulatory system: Secondary | ICD-10-CM

## 2016-11-17 DIAGNOSIS — E785 Hyperlipidemia, unspecified: Secondary | ICD-10-CM | POA: Diagnosis present

## 2016-11-17 DIAGNOSIS — I609 Nontraumatic subarachnoid hemorrhage, unspecified: Secondary | ICD-10-CM

## 2016-11-17 DIAGNOSIS — R410 Disorientation, unspecified: Secondary | ICD-10-CM | POA: Diagnosis not present

## 2016-11-17 DIAGNOSIS — S2231XD Fracture of one rib, right side, subsequent encounter for fracture with routine healing: Secondary | ICD-10-CM | POA: Diagnosis not present

## 2016-11-17 DIAGNOSIS — S2231XA Fracture of one rib, right side, initial encounter for closed fracture: Secondary | ICD-10-CM | POA: Diagnosis present

## 2016-11-17 DIAGNOSIS — R4189 Other symptoms and signs involving cognitive functions and awareness: Secondary | ICD-10-CM | POA: Diagnosis present

## 2016-11-17 DIAGNOSIS — R011 Cardiac murmur, unspecified: Secondary | ICD-10-CM | POA: Diagnosis present

## 2016-11-17 DIAGNOSIS — S7011XA Contusion of right thigh, initial encounter: Secondary | ICD-10-CM | POA: Diagnosis present

## 2016-11-17 DIAGNOSIS — R911 Solitary pulmonary nodule: Secondary | ICD-10-CM | POA: Diagnosis present

## 2016-11-17 DIAGNOSIS — F17210 Nicotine dependence, cigarettes, uncomplicated: Secondary | ICD-10-CM | POA: Diagnosis present

## 2016-11-17 DIAGNOSIS — S80211A Abrasion, right knee, initial encounter: Secondary | ICD-10-CM | POA: Diagnosis present

## 2016-11-17 DIAGNOSIS — W19XXXS Unspecified fall, sequela: Secondary | ICD-10-CM | POA: Diagnosis not present

## 2016-11-17 DIAGNOSIS — F419 Anxiety disorder, unspecified: Secondary | ICD-10-CM | POA: Diagnosis not present

## 2016-11-17 DIAGNOSIS — R05 Cough: Secondary | ICD-10-CM | POA: Diagnosis not present

## 2016-11-17 DIAGNOSIS — S069X3S Unspecified intracranial injury with loss of consciousness of 1 hour to 5 hours 59 minutes, sequela: Secondary | ICD-10-CM | POA: Diagnosis not present

## 2016-11-17 DIAGNOSIS — S06302S Unspecified focal traumatic brain injury with loss of consciousness of 31 minutes to 59 minutes, sequela: Secondary | ICD-10-CM | POA: Diagnosis not present

## 2016-11-17 DIAGNOSIS — R5381 Other malaise: Secondary | ICD-10-CM | POA: Diagnosis not present

## 2016-11-17 DIAGNOSIS — R451 Restlessness and agitation: Secondary | ICD-10-CM | POA: Diagnosis not present

## 2016-11-17 DIAGNOSIS — M7989 Other specified soft tissue disorders: Secondary | ICD-10-CM | POA: Diagnosis not present

## 2016-11-17 DIAGNOSIS — R402362 Coma scale, best motor response, obeys commands, at arrival to emergency department: Secondary | ICD-10-CM | POA: Diagnosis present

## 2016-11-17 DIAGNOSIS — M62838 Other muscle spasm: Secondary | ICD-10-CM | POA: Diagnosis not present

## 2016-11-17 DIAGNOSIS — T402X5A Adverse effect of other opioids, initial encounter: Secondary | ICD-10-CM | POA: Diagnosis not present

## 2016-11-17 DIAGNOSIS — S72301D Unspecified fracture of shaft of right femur, subsequent encounter for closed fracture with routine healing: Secondary | ICD-10-CM | POA: Diagnosis not present

## 2016-11-17 DIAGNOSIS — R4182 Altered mental status, unspecified: Secondary | ICD-10-CM | POA: Diagnosis not present

## 2016-11-17 HISTORY — DX: Displaced transverse fracture of shaft of right femur, initial encounter for closed fracture: S72.321A

## 2016-11-17 HISTORY — PX: FEMUR IM NAIL: SHX1597

## 2016-11-17 LAB — GLUCOSE, CAPILLARY
GLUCOSE-CAPILLARY: 190 mg/dL — AB (ref 65–99)
Glucose-Capillary: 178 mg/dL — ABNORMAL HIGH (ref 65–99)

## 2016-11-17 LAB — RAPID URINE DRUG SCREEN, HOSP PERFORMED
Amphetamines: NOT DETECTED
BENZODIAZEPINES: NOT DETECTED
Barbiturates: NOT DETECTED
Cocaine: NOT DETECTED
Opiates: NOT DETECTED
TETRAHYDROCANNABINOL: NOT DETECTED

## 2016-11-17 LAB — CBC
HEMATOCRIT: 41.2 % (ref 39.0–52.0)
HEMOGLOBIN: 13.7 g/dL (ref 13.0–17.0)
MCH: 31.6 pg (ref 26.0–34.0)
MCHC: 33.3 g/dL (ref 30.0–36.0)
MCV: 94.9 fL (ref 78.0–100.0)
PLATELETS: 260 10*3/uL (ref 150–400)
RBC: 4.34 MIL/uL (ref 4.22–5.81)
RDW: 12.4 % (ref 11.5–15.5)
WBC: 8.9 10*3/uL (ref 4.0–10.5)

## 2016-11-17 LAB — I-STAT CHEM 8, ED
BUN: 16 mg/dL (ref 6–20)
CALCIUM ION: 1.07 mmol/L — AB (ref 1.15–1.40)
CREATININE: 1.1 mg/dL (ref 0.61–1.24)
Chloride: 104 mmol/L (ref 101–111)
Glucose, Bld: 139 mg/dL — ABNORMAL HIGH (ref 65–99)
HCT: 41 % (ref 39.0–52.0)
HEMOGLOBIN: 13.9 g/dL (ref 13.0–17.0)
Potassium: 3.6 mmol/L (ref 3.5–5.1)
SODIUM: 140 mmol/L (ref 135–145)
TCO2: 23 mmol/L (ref 0–100)

## 2016-11-17 LAB — URINALYSIS, ROUTINE W REFLEX MICROSCOPIC
BILIRUBIN URINE: NEGATIVE
Bacteria, UA: NONE SEEN
Glucose, UA: 50 mg/dL — AB
Ketones, ur: NEGATIVE mg/dL
Leukocytes, UA: NEGATIVE
NITRITE: NEGATIVE
PH: 7 (ref 5.0–8.0)
Protein, ur: 30 mg/dL — AB
RBC / HPF: NONE SEEN RBC/hpf (ref 0–5)
SPECIFIC GRAVITY, URINE: 1.024 (ref 1.005–1.030)
Squamous Epithelial / HPF: NONE SEEN

## 2016-11-17 LAB — COMPREHENSIVE METABOLIC PANEL
ALBUMIN: 3.6 g/dL (ref 3.5–5.0)
ALT: 96 U/L — ABNORMAL HIGH (ref 17–63)
ANION GAP: 13 (ref 5–15)
AST: 143 U/L — ABNORMAL HIGH (ref 15–41)
Alkaline Phosphatase: 48 U/L (ref 38–126)
BILIRUBIN TOTAL: 1 mg/dL (ref 0.3–1.2)
BUN: 14 mg/dL (ref 6–20)
CO2: 20 mmol/L — ABNORMAL LOW (ref 22–32)
Calcium: 8.8 mg/dL — ABNORMAL LOW (ref 8.9–10.3)
Chloride: 102 mmol/L (ref 101–111)
Creatinine, Ser: 1.11 mg/dL (ref 0.61–1.24)
GFR calc Af Amer: >60 mL/min (ref >60)
Glucose, Bld: 141 mg/dL — ABNORMAL HIGH (ref 65–99)
POTASSIUM: 3.8 mmol/L (ref 3.5–5.1)
Sodium: 135 mmol/L (ref 135–145)
TOTAL PROTEIN: 7.1 g/dL (ref 6.5–8.1)

## 2016-11-17 LAB — I-STAT TROPONIN, ED: Troponin i, poc: 0 ng/mL (ref 0.00–0.08)

## 2016-11-17 LAB — ETHANOL: Alcohol, Ethyl (B): <5 mg/dL (ref <5)

## 2016-11-17 LAB — CBG MONITORING, ED: GLUCOSE-CAPILLARY: 139 mg/dL — AB (ref 65–99)

## 2016-11-17 LAB — I-STAT CG4 LACTIC ACID, ED: LACTIC ACID, VENOUS: 4.1 mmol/L — AB (ref 0.5–1.9)

## 2016-11-17 LAB — MRSA PCR SCREENING: MRSA BY PCR: NEGATIVE

## 2016-11-17 LAB — SAMPLE TO BLOOD BANK

## 2016-11-17 LAB — PROTIME-INR
INR: 1.1
Prothrombin Time: 14.2 seconds (ref 11.4–15.2)

## 2016-11-17 LAB — CDS SEROLOGY

## 2016-11-17 SURGERY — INSERTION, INTRAMEDULLARY ROD, FEMUR
Anesthesia: General | Site: Hip | Laterality: Right

## 2016-11-17 MED ORDER — LACTATED RINGERS IV SOLN
INTRAVENOUS | Status: DC
  Administered 2016-11-17 (×2): via INTRAVENOUS

## 2016-11-17 MED ORDER — BACLOFEN 10 MG PO TABS: 10.0000 mg | ORAL_TABLET | Freq: Three times a day (TID) | ORAL | 0 refills | Status: DC | PRN

## 2016-11-17 MED ORDER — PANTOPRAZOLE SODIUM 40 MG PO TBEC
40.0000 mg | DELAYED_RELEASE_TABLET | Freq: Every day | ORAL | Status: DC
  Administered 2016-11-20 – 2016-11-27 (×8): 40 mg via ORAL
  Filled 2016-11-17 (×8): qty 1

## 2016-11-17 MED ORDER — DIPHENHYDRAMINE HCL 12.5 MG/5ML PO ELIX
12.5000 mg | ORAL_SOLUTION | ORAL | Status: DC | PRN
  Filled 2016-11-17: qty 10

## 2016-11-17 MED ORDER — FENTANYL CITRATE (PF) 100 MCG/2ML IJ SOLN
INTRAMUSCULAR | Status: AC
  Filled 2016-11-17: qty 2

## 2016-11-17 MED ORDER — POVIDONE-IODINE 10 % EX SWAB: 2.0000 "application " | Freq: Once | CUTANEOUS | Status: DC

## 2016-11-17 MED ORDER — CEFAZOLIN SODIUM-DEXTROSE 2-4 GM/100ML-% IV SOLN
2.0000 g | INTRAVENOUS | Status: AC
  Administered 2016-11-17: 2 g via INTRAVENOUS

## 2016-11-17 MED ORDER — CEFAZOLIN SODIUM-DEXTROSE 2-4 GM/100ML-% IV SOLN
2.0000 g | Freq: Four times a day (QID) | INTRAVENOUS | Status: AC
  Administered 2016-11-17 – 2016-11-18 (×3): 2 g via INTRAVENOUS
  Filled 2016-11-17 (×3): qty 100

## 2016-11-17 MED ORDER — DOCUSATE SODIUM 100 MG PO CAPS: 100.0000 mg | ORAL_CAPSULE | Freq: Two times a day (BID) | ORAL | 0 refills | Status: DC

## 2016-11-17 MED ORDER — ONDANSETRON HCL 4 MG/2ML IJ SOLN
4.0000 mg | Freq: Four times a day (QID) | INTRAMUSCULAR | Status: DC | PRN
  Administered 2016-11-17 – 2016-11-18 (×2): 4 mg via INTRAVENOUS
  Filled 2016-11-17 (×3): qty 2

## 2016-11-17 MED ORDER — SUGAMMADEX SODIUM 200 MG/2ML IV SOLN
INTRAVENOUS | Status: DC | PRN
  Administered 2016-11-17: 200 mg via INTRAVENOUS

## 2016-11-17 MED ORDER — SODIUM CHLORIDE 0.9 % IV SOLN
500.0000 mg | Freq: Two times a day (BID) | INTRAVENOUS | Status: DC
  Administered 2016-11-17 (×2): 500 mg via INTRAVENOUS
  Filled 2016-11-17 (×3): qty 5

## 2016-11-17 MED ORDER — FENTANYL CITRATE (PF) 100 MCG/2ML IJ SOLN
INTRAMUSCULAR | Status: DC | PRN
  Administered 2016-11-17 (×2): 50 ug via INTRAVENOUS
  Administered 2016-11-17 (×2): 25 ug via INTRAVENOUS
  Administered 2016-11-17: 50 ug via INTRAVENOUS

## 2016-11-17 MED ORDER — OXYCODONE-ACETAMINOPHEN 5-325 MG PO TABS: 1.0000 | ORAL_TABLET | ORAL | 0 refills | Status: DC | PRN

## 2016-11-17 MED ORDER — FENTANYL CITRATE (PF) 250 MCG/5ML IJ SOLN
INTRAMUSCULAR | Status: AC
  Filled 2016-11-17: qty 5

## 2016-11-17 MED ORDER — PHENYLEPHRINE 40 MCG/ML (10ML) SYRINGE FOR IV PUSH (FOR BLOOD PRESSURE SUPPORT)
PREFILLED_SYRINGE | INTRAVENOUS | Status: AC
Start: 2016-11-17 — End: 2016-11-17
  Filled 2016-11-17: qty 10

## 2016-11-17 MED ORDER — ONDANSETRON HCL 4 MG/2ML IJ SOLN
INTRAMUSCULAR | Status: DC | PRN
  Administered 2016-11-17: 4 mg via INTRAVENOUS

## 2016-11-17 MED ORDER — FENTANYL CITRATE (PF) 100 MCG/2ML IJ SOLN
INTRAMUSCULAR | Status: AC
  Administered 2016-11-17: 50 ug via INTRAVENOUS
  Filled 2016-11-17: qty 2

## 2016-11-17 MED ORDER — METOCLOPRAMIDE HCL 5 MG PO TABS
5.0000 mg | ORAL_TABLET | Freq: Three times a day (TID) | ORAL | Status: DC | PRN
  Filled 2016-11-17: qty 2

## 2016-11-17 MED ORDER — HYDROMORPHONE HCL 1 MG/ML IJ SOLN
0.5000 mg | INTRAMUSCULAR | Status: DC | PRN
  Administered 2016-11-17 – 2016-11-18 (×6): 1 mg via INTRAVENOUS
  Administered 2016-11-18 (×3): 0.5 mg via INTRAVENOUS
  Administered 2016-11-19 (×2): 1 mg via INTRAVENOUS
  Administered 2016-11-19: 0.5 mg via INTRAVENOUS
  Administered 2016-11-19 – 2016-11-22 (×9): 1 mg via INTRAVENOUS
  Filled 2016-11-17 (×22): qty 1

## 2016-11-17 MED ORDER — SUCCINYLCHOLINE CHLORIDE 20 MG/ML IJ SOLN
INTRAMUSCULAR | Status: DC | PRN
  Administered 2016-11-17: 120 mg via INTRAVENOUS

## 2016-11-17 MED ORDER — ACETAMINOPHEN 325 MG PO TABS
650.0000 mg | ORAL_TABLET | Freq: Four times a day (QID) | ORAL | Status: AC
  Administered 2016-11-18 (×2): 650 mg via ORAL
  Filled 2016-11-17 (×3): qty 2

## 2016-11-17 MED ORDER — KETOROLAC TROMETHAMINE 15 MG/ML IJ SOLN
7.5000 mg | Freq: Four times a day (QID) | INTRAMUSCULAR | Status: AC
  Administered 2016-11-17 – 2016-11-18 (×3): 7.5 mg via INTRAVENOUS
  Filled 2016-11-17 (×3): qty 1

## 2016-11-17 MED ORDER — DOCUSATE SODIUM 100 MG PO CAPS
100.0000 mg | ORAL_CAPSULE | Freq: Two times a day (BID) | ORAL | Status: DC
  Administered 2016-11-17 – 2016-11-20 (×4): 100 mg via ORAL
  Filled 2016-11-17 (×4): qty 1

## 2016-11-17 MED ORDER — ONDANSETRON HCL 4 MG/2ML IJ SOLN
INTRAMUSCULAR | Status: DC | PRN
Start: 2016-11-17 — End: 2016-11-17

## 2016-11-17 MED ORDER — IOPAMIDOL (ISOVUE-300) INJECTION 61%
INTRAVENOUS | Status: AC
  Administered 2016-11-17: 100 mL
  Filled 2016-11-17: qty 100

## 2016-11-17 MED ORDER — LIDOCAINE HCL (CARDIAC) 20 MG/ML IV SOLN
INTRAVENOUS | Status: DC | PRN
  Administered 2016-11-17: 40 mg via INTRAVENOUS

## 2016-11-17 MED ORDER — ONDANSETRON HCL 4 MG PO TABS: 4.0000 mg | ORAL_TABLET | Freq: Four times a day (QID) | ORAL | Status: DC | PRN

## 2016-11-17 MED ORDER — ACETAMINOPHEN 650 MG RE SUPP: 650.0000 mg | Freq: Four times a day (QID) | RECTAL | Status: DC | PRN

## 2016-11-17 MED ORDER — ACETAMINOPHEN 500 MG PO TABS
1000.0000 mg | ORAL_TABLET | Freq: Once | ORAL | Status: DC
Start: 2016-11-17 — End: 2016-11-17

## 2016-11-17 MED ORDER — ONDANSETRON HCL 4 MG/2ML IJ SOLN: 4.0000 mg | Freq: Once | INTRAMUSCULAR | Status: DC | PRN

## 2016-11-17 MED ORDER — GABAPENTIN 300 MG PO CAPS: 300.0000 mg | ORAL_CAPSULE | Freq: Once | ORAL | Status: DC

## 2016-11-17 MED ORDER — PHENYLEPHRINE HCL 10 MG/ML IJ SOLN
INTRAMUSCULAR | Status: DC | PRN
  Administered 2016-11-17 (×2): 80 ug via INTRAVENOUS

## 2016-11-17 MED ORDER — BACLOFEN 10 MG PO TABS
10.0000 mg | ORAL_TABLET | Freq: Three times a day (TID) | ORAL | Status: DC | PRN
  Filled 2016-11-17: qty 1

## 2016-11-17 MED ORDER — OXYCODONE HCL 5 MG PO TABS
5.0000 mg | ORAL_TABLET | ORAL | Status: DC | PRN
  Administered 2016-11-21 – 2016-11-26 (×8): 10 mg via ORAL
  Filled 2016-11-17 (×8): qty 2

## 2016-11-17 MED ORDER — PHENYLEPHRINE HCL 10 MG/ML IJ SOLN
INTRAMUSCULAR | Status: DC | PRN
  Administered 2016-11-17: 25 ug/min via INTRAVENOUS

## 2016-11-17 MED ORDER — HYDROMORPHONE HCL 1 MG/ML IJ SOLN
1.0000 mg | Freq: Once | INTRAMUSCULAR | Status: AC
  Administered 2016-11-17: 1 mg via INTRAVENOUS
  Filled 2016-11-17: qty 1

## 2016-11-17 MED ORDER — METOCLOPRAMIDE HCL 5 MG/ML IJ SOLN: 5.0000 mg | Freq: Three times a day (TID) | INTRAMUSCULAR | Status: DC | PRN

## 2016-11-17 MED ORDER — ONDANSETRON HCL 4 MG/2ML IJ SOLN
INTRAMUSCULAR | Status: AC
  Filled 2016-11-17: qty 2

## 2016-11-17 MED ORDER — FENTANYL CITRATE (PF) 100 MCG/2ML IJ SOLN
INTRAMUSCULAR | Status: DC | PRN
  Administered 2016-11-17 (×2): 100 ug via INTRAVENOUS

## 2016-11-17 MED ORDER — PANTOPRAZOLE SODIUM 40 MG IV SOLR
40.0000 mg | Freq: Every day | INTRAVENOUS | Status: DC
  Administered 2016-11-18 – 2016-11-19 (×2): 40 mg via INTRAVENOUS
  Filled 2016-11-17 (×2): qty 40

## 2016-11-17 MED ORDER — CHLORHEXIDINE GLUCONATE 4 % EX LIQD
60.0000 mL | Freq: Once | CUTANEOUS | Status: DC
  Filled 2016-11-17: qty 60

## 2016-11-17 MED ORDER — PROPOFOL 10 MG/ML IV BOLUS
INTRAVENOUS | Status: DC | PRN
  Administered 2016-11-17: 170 mg via INTRAVENOUS

## 2016-11-17 MED ORDER — ACETAMINOPHEN 325 MG PO TABS
650.0000 mg | ORAL_TABLET | Freq: Four times a day (QID) | ORAL | Status: DC | PRN
  Administered 2016-11-26: 650 mg via ORAL
  Filled 2016-11-17: qty 2

## 2016-11-17 MED ORDER — SORBITOL 70 % SOLN
30.0000 mL | Freq: Every day | Status: DC | PRN
  Filled 2016-11-17: qty 30

## 2016-11-17 MED ORDER — MAGNESIUM CITRATE PO SOLN: 1.0000 | Freq: Once | ORAL | Status: DC | PRN

## 2016-11-17 MED ORDER — FENTANYL CITRATE (PF) 100 MCG/2ML IJ SOLN
100.0000 ug | Freq: Once | INTRAMUSCULAR | Status: DC
  Filled 2016-11-17: qty 2

## 2016-11-17 MED ORDER — ROCURONIUM BROMIDE 100 MG/10ML IV SOLN
INTRAVENOUS | Status: DC | PRN
  Administered 2016-11-17: 40 mg via INTRAVENOUS

## 2016-11-17 MED ORDER — FENTANYL CITRATE (PF) 100 MCG/2ML IJ SOLN
25.0000 ug | INTRAMUSCULAR | Status: DC | PRN
  Administered 2016-11-17 (×2): 50 ug via INTRAVENOUS

## 2016-11-17 MED ORDER — 0.9 % SODIUM CHLORIDE (POUR BTL) OPTIME
TOPICAL | Status: DC | PRN
  Administered 2016-11-17: 1000 mL

## 2016-11-17 MED ORDER — LACTATED RINGERS IV SOLN
INTRAVENOUS | Status: DC
  Administered 2016-11-17 – 2016-11-20 (×6): via INTRAVENOUS

## 2016-11-17 MED ORDER — PROPOFOL 10 MG/ML IV BOLUS
INTRAVENOUS | Status: AC
  Filled 2016-11-17: qty 20

## 2016-11-17 MED ORDER — SENNA 8.6 MG PO TABS
1.0000 | ORAL_TABLET | Freq: Two times a day (BID) | ORAL | Status: DC
  Administered 2016-11-17 – 2016-11-27 (×13): 8.6 mg via ORAL
  Filled 2016-11-17 (×14): qty 1

## 2016-11-17 MED ORDER — SODIUM CHLORIDE 0.9 % IV SOLN: INTRAVENOUS | Status: DC

## 2016-11-17 MED ORDER — POLYETHYLENE GLYCOL 3350 17 G PO PACK: 17.0000 g | PACK | Freq: Every day | ORAL | Status: DC | PRN

## 2016-11-17 MED ORDER — SUGAMMADEX SODIUM 200 MG/2ML IV SOLN
INTRAVENOUS | Status: AC
  Filled 2016-11-17: qty 2

## 2016-11-17 SURGICAL SUPPLY — 43 items
BIT DRILL AO GAMMA 4.2X180 (BIT) ×4 IMPLANT
CLOSURE STERI-STRIP 1/2X4 (GAUZE/BANDAGES/DRESSINGS) ×1
CLSR STERI-STRIP ANTIMIC 1/2X4 (GAUZE/BANDAGES/DRESSINGS) ×2 IMPLANT
COVER PERINEAL POST (MISCELLANEOUS) ×3 IMPLANT
COVER SURGICAL LIGHT HANDLE (MISCELLANEOUS) ×3 IMPLANT
DRAPE STERI IOBAN 125X83 (DRAPES) ×3 IMPLANT
DRSG MEPILEX BORDER 4X4 (GAUZE/BANDAGES/DRESSINGS) ×4 IMPLANT
DURAPREP 26ML APPLICATOR (WOUND CARE) ×3 IMPLANT
ELECT REM PT RETURN 9FT ADLT (ELECTROSURGICAL) ×3
ELECTRODE REM PT RTRN 9FT ADLT (ELECTROSURGICAL) ×1 IMPLANT
GLOVE BIO SURGEON STRL SZ7.5 (GLOVE) ×10 IMPLANT
GLOVE BIOGEL PI IND STRL 8 (GLOVE) ×2 IMPLANT
GLOVE BIOGEL PI INDICATOR 8 (GLOVE) ×4
GOWN STRL REUS W/ TWL LRG LVL3 (GOWN DISPOSABLE) ×3 IMPLANT
GOWN STRL REUS W/TWL LRG LVL3 (GOWN DISPOSABLE) ×9
GUIDEROD T2 3X1000 (ROD) ×2 IMPLANT
K-WIRE  3.2X450M STR (WIRE) ×4
K-WIRE 3.2X450M STR (WIRE) ×2
KIT BASIN OR (CUSTOM PROCEDURE TRAY) ×3 IMPLANT
KIT ROOM TURNOVER OR (KITS) ×3 IMPLANT
KWIRE 3.2X450M STR (WIRE) IMPLANT
MANIFOLD NEPTUNE II (INSTRUMENTS) ×1 IMPLANT
NAIL GAMMA LG R 5TI 10X400X125 (Nail) ×2 IMPLANT
NS IRRIG 1000ML POUR BTL (IV SOLUTION) ×3 IMPLANT
PACK GENERAL/GYN (CUSTOM PROCEDURE TRAY) ×3 IMPLANT
PAD ARMBOARD 7.5X6 YLW CONV (MISCELLANEOUS) ×6 IMPLANT
REAMER SHAFT BIXCUT (INSTRUMENTS) ×2 IMPLANT
SCREW LAG GAMMA 3 TI 10.5X85MM (Screw) ×2 IMPLANT
SCREW LOCKING T2 F/T  5MMX45MM (Screw) ×2 IMPLANT
SCREW LOCKING T2 F/T  5MMX50MM (Screw) ×2 IMPLANT
SCREW LOCKING T2 F/T 5MMX45MM (Screw) IMPLANT
SCREW LOCKING T2 F/T 5MMX50MM (Screw) IMPLANT
SUT MNCRL AB 4-0 PS2 18 (SUTURE) ×2 IMPLANT
SUT MON AB 2-0 CT1 36 (SUTURE) ×4 IMPLANT
SUT VIC AB 0 CT1 27 (SUTURE) ×3
SUT VIC AB 0 CT1 27XBRD ANBCTR (SUTURE) ×1 IMPLANT
SUT VIC AB 1 CT1 27 (SUTURE) ×3
SUT VIC AB 1 CT1 27XBRD ANBCTR (SUTURE) IMPLANT
SUT VIC AB 2-0 CT1 27 (SUTURE) ×3
SUT VIC AB 2-0 CT1 TAPERPNT 27 (SUTURE) IMPLANT
TOWEL OR 17X24 6PK STRL BLUE (TOWEL DISPOSABLE) ×1 IMPLANT
TOWEL OR 17X26 10 PK STRL BLUE (TOWEL DISPOSABLE) ×3 IMPLANT
WATER STERILE IRR 1000ML POUR (IV SOLUTION) ×3 IMPLANT
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
## 2016-11-17 NOTE — ED Notes (Signed)
Ortho at bedside.

## 2016-11-17 NOTE — H&P (Addendum)
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 patient Judeth Horn 11/17/2016, 11:02 AM   Procedures

## 2016-11-17 NOTE — Anesthesia Procedure Notes (Signed)
Procedure Name: Intubation Date/Time: 11/17/2016 1:33 PM Performed by: Rejeana Brock L Pre-anesthesia Checklist: Patient identified, Emergency Drugs available, Suction available and Patient being monitored Patient Re-evaluated:Patient Re-evaluated prior to inductionOxygen Delivery Method: Circle System Utilized Preoxygenation: Pre-oxygenation with 100% oxygen Intubation Type: IV induction, Rapid sequence and Cricoid Pressure applied Ventilation: Mask ventilation without difficulty Laryngoscope Size: Mac and 4 Grade View: Grade III Tube type: Oral Tube size: 7.5 mm Number of attempts: 1 Airway Equipment and Method: Stylet and Oral airway Placement Confirmation: ETT inserted through vocal cords under direct vision,  positive ETCO2 and breath sounds checked- equal and bilateral Secured at: 22 cm Tube secured with: Tape Dental Injury: Teeth and Oropharynx as per pre-operative assessment  Difficulty Due To: Difficult Airway- due to anterior larynx Future Recommendations: Recommend- induction with short-acting agent, and alternative techniques readily available

## 2016-11-17 NOTE — Progress Notes (Signed)
I have examined the patient. He has a large developing hematoma in his R thigh. He would benefit greatly from stabilization of his leg out to length to limit his blood loss and contain his hematoma. I have been unable to reach his family after multiple attempts.   I will plan to take him to the OR emergently today for IM nail vs. Traction pin placement.     Herold Salguero D

## 2016-11-17 NOTE — Anesthesia Preprocedure Evaluation (Addendum)
Anesthesia Evaluation  Patient identified by MRN, date of birth, ID band Patient confused    Reviewed: Patient's Chart, lab work & pertinent test results, Unable to perform ROS - Chart review only  Airway Mallampati: III  TM Distance: >3 FB     Dental  (+) Teeth Intact,    Pulmonary    breath sounds clear to auscultation       Cardiovascular hypertension,  Rhythm:Regular Rate:Normal     Neuro/Psych    GI/Hepatic   Endo/Other  diabetes  Renal/GU      Musculoskeletal   Abdominal   Peds  Hematology   Anesthesia Other Findings   Reproductive/Obstetrics                           Anesthesia Physical Anesthesia Plan  ASA: III  Anesthesia Plan: General   Post-op Pain Management:    Induction: Intravenous  Airway Management Planned: Oral ETT  Additional Equipment:   Intra-op Plan:   Post-operative Plan: Extubation in OR  Informed Consent: I have reviewed the patients History and Physical, chart, labs and discussed the procedure including the risks, benefits and alternatives for the proposed anesthesia with the patient or authorized representative who has indicated his/her understanding and acceptance.     Plan Discussed with: CRNA and Anesthesiologist  Anesthesia Plan Comments:         Anesthesia Quick Evaluation

## 2016-11-17 NOTE — Progress Notes (Signed)
Orthopedic Tech Progress Note Patient Details:  Jerry Zimmerman Jun 22, 1941 [id number]  Patient ID: Jerry Zimmerman, male   DOB: 18-Oct-1940, 76 y.o.   MRN: [id number]   Jerry Zimmerman 11/17/2016, 12:19 PMTransferred patient and buck's tx. from stretcher to bed

## 2016-11-17 NOTE — Progress Notes (Signed)
Jerry Zimmerman phone [phone]
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
## 2016-11-17 NOTE — Progress Notes (Signed)
Orthopedic Tech Progress Note Patient Details:  Jerry Zimmerman 01-Jan-1941 [id number]  Musculoskeletal Traction Type of Traction: Bucks Skin Traction Traction Weight: 10 lbs    [name] 11/17/2016, 11:27 AM

## 2016-11-17 NOTE — Transfer of Care (Signed)
Immediate Anesthesia Transfer of Care Note  Patient: Estill Dooms  Procedure(s) Performed: Procedure(s): INTRAMEDULLARY (IM) NAIL FEMORAL (Right)  Patient Location: PACU  Anesthesia Type:General  Level of Consciousness: sedated  Airway & Oxygen Therapy: Patient Spontanous Breathing and Patient connected to face mask oxygen  Post-op Assessment: Report given to RN and Post -op Vital signs reviewed and stable  Post vital signs: Reviewed and stable  Last Vitals:  Vitals:   11/17/16 1210 11/17/16 1217  BP: (!) 161/70   Pulse: 100 94  Resp: 17 14  Temp: 36.7 C     Last Pain:  Vitals:   11/17/16 1217  TempSrc:   PainSc: Asleep         Complications: No apparent anesthesia complications

## 2016-11-17 NOTE — Progress Notes (Signed)
   Met w/ patient _0 .  Pt. unable to provide emergency contact information.  Will follow, as needed.  - Rev. Duval MDiv ThM
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
## 2016-11-17 NOTE — ED Notes (Signed)
Pt placed on 3 liters O2 n/c after 2nd dose of pain meds due to drop in sats to 88 %
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
## 2016-11-17 NOTE — Anesthesia Postprocedure Evaluation (Addendum)
Anesthesia Post Note  Patient: Jerry Zimmerman  Procedure(s) Performed: Procedure(s) (LRB): INTRAMEDULLARY (IM) NAIL FEMORAL (Right)  Patient location during evaluation: PACU Anesthesia Type: General Level of consciousness: awake, awake and alert and oriented Pain management: pain level controlled Vital Signs Assessment: post-procedure vital signs reviewed and stable Respiratory status: spontaneous breathing, nonlabored ventilation and respiratory function stable Cardiovascular status: blood pressure returned to baseline Anesthetic complications: no       Last Vitals:  Vitals:   11/17/16 1845 11/17/16 1900  BP: (!) 127/54 (!) 104/53  Pulse: 100 93  Resp: (!) 36 12  Temp:      Last Pain:  Vitals:   11/17/16 1800  TempSrc:   PainSc: 9                  Brina Umeda COKER

## 2016-11-18 ENCOUNTER — Encounter (HOSPITAL_COMMUNITY): Payer: Self-pay | Admitting: Certified Registered Nurse Anesthetist

## 2016-11-18 LAB — CBC
HCT: 30.3 % — ABNORMAL LOW (ref 39.0–52.0)
HCT: 31.9 % — ABNORMAL LOW (ref 39.0–52.0)
HEMOGLOBIN: 10.3 g/dL — AB (ref 13.0–17.0)
Hemoglobin: 10 g/dL — ABNORMAL LOW (ref 13.0–17.0)
MCH: 31.8 pg (ref 26.0–34.0)
MCH: 31.2 pg (ref 26.0–34.0)
MCHC: 33 g/dL (ref 30.0–36.0)
MCHC: 32.3 g/dL (ref 30.0–36.0)
MCV: 96.5 fL (ref 78.0–100.0)
MCV: 96.7 fL (ref 78.0–100.0)
PLATELETS: 189 10*3/uL (ref 150–400)
Platelets: 191 10*3/uL (ref 150–400)
RBC: 3.14 MIL/uL — ABNORMAL LOW (ref 4.22–5.81)
RBC: 3.3 MIL/uL — AB (ref 4.22–5.81)
RDW: 12.9 % (ref 11.5–15.5)
RDW: 12.9 % (ref 11.5–15.5)
WBC: 7.3 10*3/uL (ref 4.0–10.5)
WBC: 6.6 10*3/uL (ref 4.0–10.5)

## 2016-11-18 LAB — BASIC METABOLIC PANEL
Anion gap: 7 (ref 5–15)
BUN: 17 mg/dL (ref 6–20)
CHLORIDE: 104 mmol/L (ref 101–111)
CO2: 26 mmol/L (ref 22–32)
Calcium: 7.5 mg/dL — ABNORMAL LOW (ref 8.9–10.3)
Creatinine, Ser: 1.33 mg/dL — ABNORMAL HIGH (ref 0.61–1.24)
GFR calc Af Amer: 58 mL/min — ABNORMAL LOW (ref >60)
GFR calc non Af Amer: 50 mL/min — ABNORMAL LOW (ref >60)
Glucose, Bld: 140 mg/dL — ABNORMAL HIGH (ref 65–99)
POTASSIUM: 4.8 mmol/L (ref 3.5–5.1)
SODIUM: 137 mmol/L (ref 135–145)

## 2016-11-18 MED ORDER — LEVETIRACETAM 500 MG/5ML IV SOLN
500.0000 mg | Freq: Two times a day (BID) | INTRAVENOUS | Status: DC
  Administered 2016-11-18 – 2016-11-21 (×7): 500 mg via INTRAVENOUS
  Filled 2016-11-18 (×7): qty 5

## 2016-11-18 MED ORDER — ALBUMIN HUMAN 5 % IV SOLN
25.0000 g | Freq: Once | INTRAVENOUS | Status: AC
  Administered 2016-11-18: 25 g via INTRAVENOUS
  Filled 2016-11-18: qty 500

## 2016-11-18 MED ORDER — SODIUM CHLORIDE 0.9 % IV BOLUS (SEPSIS)
500.0000 mL | Freq: Once | INTRAVENOUS | Status: AC
  Administered 2016-11-18: 500 mL via INTRAVENOUS

## 2016-11-18 NOTE — Progress Notes (Signed)
Pt seen and examined.  No issues overnight from neurosurgical standpoint Language barrier complicating history Endorses pain of right leg and headache  EXAM: Temp:  [97 F (36.1 C)-98.1 F (36.7 C)] 97.7 F (36.5 C) (04/08 0348) Pulse Rate:  [76-100] 85 (04/08 0700) Resp:  [9-36] 16 (04/08 0700) BP: (81-176)/(47-115) 103/55 (04/08 0700) SpO2:  [85 %-100 %] 100 % (04/08 0700) Weight:  [84.4 kg (186 lb)-89.1 kg (196 lb 6.9 oz)] 89.1 kg (196 lb 6.9 oz) (04/07 1800) Intake/Output      04/07 0701 - 04/08 0700 04/08 0701 - 04/09 0700   I.V. (mL/kg) 3315 (37.2)    IV Piggyback 200    Total Intake(mL/kg) 3515 (39.5)    Urine (mL/kg/hr) 1400    Blood 300    Total Output 1700     Net +1815           Awake and alert Follows commands throughout Strength appropriate with exception of RLE (known femur fracture).  MAE    Repeat CT looks good. No need for NS intervention. Keppra BID x7 days  Will sign off.  Continue to monitor neuro exam Call for any concerns.
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
## 2016-11-18 NOTE — Progress Notes (Signed)
   Assessment: 1 Day Post-Op  S/P Procedure(s) (LRB): INTRAMEDULLARY (IM) NAIL FEMORAL (Right) by Dr. Jewel Baize. Murphy on 11/17/16  Active Problems:   Subarachnoid hemorrhage (HCC)   Displaced transverse fracture of shaft of right femur, initial encounter for closed fracture (HCC)   Plan: Up with therapy  Weight Bearing: Weight Bearing as Tolerated (WBAT)  Dressings: Dry Dressing PRN.  VTE prophylaxis: Per primary dt subarchnoid hemorrage.  SCDs, ambulation.  Normally would give Lovenox while inpatient and ASA 325 mg for 30 days post op. Dispo: Per primary.  PT eval pending.    Subjective: Patient reports pain as moderate to severe.  Up to edge of bed w/ PT.  Objective:   VITALS:   Vitals:   11/18/16 0300 11/18/16 0348 11/18/16 0400 11/18/16 0700  BP: (!) 94/52  (!) 86/47 (!) 103/55  Pulse: 87  88 85  Resp: (!) Temp:  97.7 F (36.5 C)    TempSrc:  Oral    SpO2: 99%  100% 100%  Weight:      Height:       CBC Latest Ref Rng & Units 11/18/2016 11/18/2016 11/17/2016  WBC 4.0 - 10.5 K/uL 6.6 7.3 8.9  Hemoglobin 13.0 - 17.0 g/dL 10.3(L) 10.0(L) 13.7  Hematocrit 39.0 - 52.0 % 31.9(L) 30.3(L) 41.2  Platelets 150 - 400 K/uL 191 189 260   BMP Latest Ref Rng & Units 11/18/2016 11/17/2016 11/17/2016  Glucose 65 - 99 mg/dL 161(W) 960(A) 540(J)  BUN 6 - 20 mg/dL Creatinine 0.61 - 1.24 mg/dL 8.11(B) 1.47 8.29  Sodium 135 - 145 mmol/L 137 135 140  Potassium 3.5 - 5.1 mmol/L 4.8 3.8 3.6  Chloride 101 - 111 mmol/L 104 102 104  CO2 22 - 32 mmol/L 26 20(L) -  Calcium 8.9 - 10.3 mg/dL 7.5(L) 8.8(L) -   Intake/Output      04/07 0701 - 04/08 0700 04/08 0701 - 04/09 0700   I.V. (mL/kg) 3315 (37.2)    IV Piggyback 200    Total Intake(mL/kg) 3515 (39.5)    Urine (mL/kg/hr) 1400    Blood 300    Total Output 1700     Net +1815            Physical Exam: General: Somnolent.  Friend at bedside.  No increased wob MSK Thigh Soft. Sensation intact distally Feet  warm Dorsiflexion/Plantar flexion, EHL,FHL, intact Incision: dressing C/D/I   Albina Billet III, PA-C 11/18/2016, 9:10 AM

## 2016-11-19 ENCOUNTER — Encounter (HOSPITAL_COMMUNITY): Payer: Self-pay | Admitting: Orthopedic Surgery

## 2016-11-19 MED ORDER — LORAZEPAM 2 MG/ML IJ SOLN
INTRAMUSCULAR | Status: AC
  Administered 2016-11-19: 0.5 mg
  Filled 2016-11-19: qty 1

## 2016-11-19 MED ORDER — LORAZEPAM 2 MG/ML IJ SOLN
0.5000 mg | INTRAMUSCULAR | Status: DC | PRN
  Administered 2016-11-19 – 2016-11-20 (×2): 0.5 mg via INTRAVENOUS
  Filled 2016-11-19 (×4): qty 1

## 2016-11-19 MED ORDER — SODIUM CHLORIDE 0.9 % IV SOLN
0.4000 ug/kg/h | INTRAVENOUS | Status: DC
  Filled 2016-11-19: qty 2

## 2016-11-19 MED ORDER — CLONAZEPAM 0.5 MG PO TBDP
0.5000 mg | ORAL_TABLET | Freq: Two times a day (BID) | ORAL | Status: DC
  Administered 2016-11-19 – 2016-11-26 (×15): 0.5 mg via ORAL
  Filled 2016-11-19 (×17): qty 1

## 2016-11-19 MED ORDER — RAMELTEON 8 MG PO TABS
8.0000 mg | ORAL_TABLET | Freq: Every day | ORAL | Status: DC
  Administered 2016-11-19: 8 mg via ORAL
  Filled 2016-11-19: qty 1
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
## 2016-11-19 NOTE — Progress Notes (Signed)
At 0830, pt became very agitated, combative and tried to get out of bed.  Family was at bedside.  Pt nodded he was in pain when family asked.  Dr. Lindie Spruce was also at the bedside and ordered Klonopin 0.5 mg, Precedex drip and restraints.  Ativan 0.5 mg , Dilaudid 1 mg and Klonopin 0.5 mg were given.  Pt became calm and fell asleep. Precedex drip not started at that time.

## 2016-11-19 NOTE — Progress Notes (Signed)
OT Cancellation Note  Patient Details Name: Jerry Zimmerman MRN: [id number] DOB: 07/26/1941   Cancelled Treatment:    Reason Eval/Treat Not Completed: Medical issues which prohibited therapy.  Pt transferred to ICU from step down due to agitation.  He was given Ativan and placed in restraints.  Will reattempt.  Francheska Villeda [place], OTR/L [phone]   Jeani Hawking M 11/19/2016, 11:09 AM
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
## 2016-11-19 NOTE — Progress Notes (Signed)
Asked by bedside RN to come assist transferring combative, confused pt to 3M07.  SWOT RN, 4N RN, and myself transferred pt to [phone].

## 2016-11-19 NOTE — Progress Notes (Signed)
   Assessment / Plan: 2 Days Post-Op  S/P Procedure(s) (LRB): INTRAMEDULLARY (IM) NAIL FEMORAL (Right) by Dr. Jewel Baize. Murphy on 11/17/16  Active Problems:   Subarachnoid hemorrhage (HCC)   Displaced transverse fracture of shaft of right femur, initial encounter for closed fracture (HCC)   Up with therapy Weight Bearing: Weight Bearing as Tolerated (WBAT)  Dressings: Dry Dressings PRN.  VTE prophylaxis: Per primary dt subarchnoid hemorrage.  SCDs, ambulation.  Normally would give Lovenox while inpatient and ASA 325 mg for 30 days post op. Dispo: Per primary.  PT recommending CIR  Subjective: Patient somnolent, but responding to commands.  Not conversant - language a barrier to communication.  Interaction aided by friends in room.  Objective:   VITALS:   Vitals:   11/19/16 0358 11/19/16 0400 11/19/16 0500 11/19/16 0600  BP:  (!) 143/73 (!) 141/86 (!) 150/78  Pulse:  (!) 112 (!) 108 (!) 103  Resp:  Temp: 98.9 F (37.2 C)     TempSrc: Axillary     SpO2:  96% 98% 99%  Weight:      Height:       CBC Latest Ref Rng & Units 11/18/2016 11/18/2016 11/17/2016  WBC 4.0 - 10.5 K/uL 6.6 7.3 8.9  Hemoglobin 13.0 - 17.0 g/dL 10.3(L) 10.0(L) 13.7  Hematocrit 39.0 - 52.0 % 31.9(L) 30.3(L) 41.2  Platelets 150 - 400 K/uL 191 189 260   BMP Latest Ref Rng & Units 11/18/2016 11/17/2016 11/17/2016  Glucose 65 - 99 mg/dL 098(J) 191(Y) 782(N)  BUN 6 - 20 mg/dL Creatinine 0.61 - 1.24 mg/dL 5.62(Z) 3.08 6.57  Sodium 135 - 145 mmol/L 137 135 140  Potassium 3.5 - 5.1 mmol/L 4.8 3.8 3.6  Chloride 101 - 111 mmol/L 104 102 104  CO2 22 - 32 mmol/L 26 20(L) -  Calcium 8.9 - 10.3 mg/dL 7.5(L) 8.8(L) -   Intake/Output      04/08 0701 - 04/09 0700 04/09 0701 - 04/10 0700   I.V. (mL/kg) 2300 (25.8)    IV Piggyback 105    Total Intake(mL/kg) 2405 (27)    Urine (mL/kg/hr) 1395 (0.7)    Blood     Total Output 1395     Net +1010            Physical Exam: General: Somnolent.  Friends at  bedside.  No increased wob MSK Thigh Soft. Sensation intact distally Feet warm Dorsiflexion/Plantar flexion, EHL,FHL, intact Incision: dressing C/D/I   Albina Billet III, PA-C 11/19/2016, 8:16 AM

## 2016-11-19 NOTE — Progress Notes (Signed)
PT Cancellation Note  Patient Details Name: Shail Urbas MRN: [id number] DOB: February 13, 1941   Cancelled Treatment:    Reason Eval/Treat Not Completed: Fatigue/lethargy limiting ability to participate Pt just transferred back from step down due to agitation, trying to climb out bed and not following commands. Given ativan and placed in restraints. Will follow up in PM or tomorrow when pt able to participate in therapy.   Blake Divine A Brei Pociask 11/19/2016, 9:56 AM Mylo Red, PT, DPT [phone]
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
## 2016-11-20 DIAGNOSIS — S72321A Displaced transverse fracture of shaft of right femur, initial encounter for closed fracture: Secondary | ICD-10-CM

## 2016-11-20 DIAGNOSIS — S069X3S Unspecified intracranial injury with loss of consciousness of 1 hour to 5 hours 59 minutes, sequela: Secondary | ICD-10-CM

## 2016-11-20 MED ORDER — HALOPERIDOL LACTATE 5 MG/ML IJ SOLN
5.0000 mg | Freq: Four times a day (QID) | INTRAMUSCULAR | Status: DC | PRN
Start: 2016-11-20 — End: 2016-11-21
  Administered 2016-11-20: 5 mg via INTRAVENOUS
  Filled 2016-11-20: qty 1

## 2016-11-20 MED ORDER — SODIUM CHLORIDE 0.9 % IV SOLN
INTRAVENOUS | Status: DC
  Administered 2016-11-20 – 2016-11-22 (×5): via INTRAVENOUS

## 2016-11-20 MED ORDER — ENOXAPARIN SODIUM 40 MG/0.4ML ~~LOC~~ SOLN
40.0000 mg | SUBCUTANEOUS | Status: DC
  Administered 2016-11-20 – 2016-11-27 (×8): 40 mg via SUBCUTANEOUS
  Filled 2016-11-20 (×8): qty 0.4
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
## 2016-11-20 NOTE — Progress Notes (Signed)
   Assessment / Plan: 3 Days Post-Op  S/P Procedure(s) (LRB): INTRAMEDULLARY (IM) NAIL FEMORAL (Right) by Dr. Jewel Baize. Murphy on 11/17/16  Active Problems:   Subarachnoid hemorrhage (HCC)   Displaced transverse fracture of shaft of right femur, initial encounter for closed fracture (HCC)  Stable from an orthopedic perspective.  Delirium limits progress / therapy. Mobilize with therapy - Agree with recommendation for CIR.    Weight Bearing: Weight Bearing as Tolerated (WBAT)  Dressings: Dry Dressings PRN.  VTE prophylaxis: Per primary dt subarchnoid hemorrage.  SCDs, ambulation.  Normally would give Lovenox while inpatient and ASA 325 mg for 30 days post op. Dispo: Per primary.  PT recommending CIR  Follow up with Dr. Wandra Feinstein in 2 weeks either in the office or in CIR.  Please call with questions.  Subjective: Patient somnolent, but responding to commands.  Interaction aided by friends in room.  Objective:   VITALS:   Vitals:   11/20/16 0500 11/20/16 0600 11/20/16 0700 11/20/16 0800  BP: 125/72 113/70 (!) 143/69   Pulse: 96 90 91   Resp: Temp:    98.8 F (37.1 C)  TempSrc:    Axillary  SpO2: 100% 100% 100%   Weight:      Height:       CBC Latest Ref Rng & Units 11/18/2016 11/18/2016 11/17/2016  WBC 4.0 - 10.5 K/uL 6.6 7.3 8.9  Hemoglobin 13.0 - 17.0 g/dL 10.3(L) 10.0(L) 13.7  Hematocrit 39.0 - 52.0 % 31.9(L) 30.3(L) 41.2  Platelets 150 - 400 K/uL 191 189 260   BMP Latest Ref Rng & Units 11/18/2016 11/17/2016 11/17/2016  Glucose 65 - 99 mg/dL 161(W) 960(A) 540(J)  BUN 6 - 20 mg/dL Creatinine 0.61 - 1.24 mg/dL 8.11(B) 1.47 8.29  Sodium 135 - 145 mmol/L 137 135 140  Potassium 3.5 - 5.1 mmol/L 4.8 3.8 3.6  Chloride 101 - 111 mmol/L 104 102 104  CO2 22 - 32 mmol/L 26 20(L) -  Calcium 8.9 - 10.3 mg/dL 7.5(L) 8.8(L) -   Intake/Output      04/09 0701 - 04/10 0700 04/10 0701 - 04/11 0700   I.V. (mL/kg) 2300 (25.8)    IV Piggyback 210    Total Intake(mL/kg)  2510 (28.2)    Urine (mL/kg/hr) 2320 (1.1)    Total Output 2320     Net +190            Physical Exam: General: Somnolent.  Friends at bedside.  No increased wob.  Responds to commands. MSK Thigh Soft. Sensation intact distally Feet warm Dorsiflexion/Plantar flexion, EHL,FHL, intact Incision: dressing C/D/I   Albina Billet III, PA-C 11/20/2016, 8:31 AM
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
## 2016-11-20 NOTE — Progress Notes (Signed)
Trauma Service Note  Subjective: Delirious this morning, repeatedly saying "untie me" per friends at bedside.   Objective: Vital signs in last 24 hours: Temp:  [97.8 F (36.6 C)-98.6 F (37 C)] 97.9 F (36.6 C) (04/10 0400) Pulse Rate:  [83-109] 91 (04/10 0700) Resp:  [10-21] 13 (04/10 0700) BP: (113-172)/(59-83) 143/69 (04/10 0700) SpO2:  [95 %-100 %] 100 % (04/10 0700) Last BM Date:  (pta)  Intake/Output from previous day: 04/09 0701 - 04/10 0700 In: 2510 [I.V.:2300; IV Piggyback:210] Out: 2320 [Urine:2320] Intake/Output this shift: No intake/output data recorded.  General: Restless, confused  Lungs: Clear, symmetrical air entry  Abd: Soft, nontender, nondistended  Extremities: RLE with dressing clean and dry on lateral thigh. Mild edema of thigh, soft. Distal pulses intact  Neuro: Moving all 4 extremities, confused  Lab Results: CBC   Recent Labs  11/18/16 0106 11/18/16 0330  WBC 7.3 6.6  HGB 10.0* 10.3*  HCT 30.3* 31.9*  PLT 189 191   BMET  Recent Labs  11/17/16 0807 11/18/16 0330  NA 135 137  K 3.8 4.8  CL 102 104  CO2 20* 26  GLUCOSE 141* 140*  BUN 14 17  CREATININE 1.11 1.33*  CALCIUM 8.8* 7.5*   PT/INR  Recent Labs  11/17/16 0807  LABPROT 14.2  INR 1.10   ABG No results for input(s): PHART, HCO3 in the last 72 hours.  Invalid input(s): PCO2, PO2  Studies/Results: No results found.  Anti-infectives: Anti-infectives    Start     Dose/Rate Route Frequency Ordered Stop   11/18/16 0600  ceFAZolin (ANCEF) IVPB 2g/100 mL premix     2 g 200 mL/hr over 30 Minutes Intravenous On call to O.R. 11/17/16 1216 11/17/16 1335   11/17/16 1830  ceFAZolin (ANCEF) IVPB 2g/100 mL premix     2 g 200 mL/hr over 30 Minutes Intravenous Every 6 hours 11/17/16 1603 11/18/16 0703      Assessment/Plan: s/p Procedure(s): INTRAMEDULLARY (IM) NAIL FEMORAL Acute urinary retention: continue foley  Keep ICU for today Sleep hygiene Add haldol Slight  increase creatinine- urine output has been good. Change IVf to normal saline due to K+. Recheck labs tomorrow   LOS: 3 days   Berna Bue MD 11/20/2016

## 2016-11-20 NOTE — Progress Notes (Signed)
Rehab Admissions Coordinator Note:  Patient was screened by Trish Mage for appropriateness for an Inpatient Acute Rehab Consult.  At this time, we are recommending Inpatient Rehab consult.  Trish Mage 11/20/2016, 12:31 PM  I can be reached at [phone].

## 2016-11-21 LAB — BASIC METABOLIC PANEL
Anion gap: 8 (ref 5–15)
BUN: 10 mg/dL (ref 6–20)
CHLORIDE: 103 mmol/L (ref 101–111)
CO2: 28 mmol/L (ref 22–32)
Calcium: 7.7 mg/dL — ABNORMAL LOW (ref 8.9–10.3)
Creatinine, Ser: 0.79 mg/dL (ref 0.61–1.24)
GFR calc Af Amer: >60 mL/min (ref >60)
GFR calc non Af Amer: >60 mL/min (ref >60)
Glucose, Bld: 97 mg/dL (ref 65–99)
Potassium: 3.4 mmol/L — ABNORMAL LOW (ref 3.5–5.1)
SODIUM: 139 mmol/L (ref 135–145)

## 2016-11-21 LAB — CBC
HCT: 24.8 % — ABNORMAL LOW (ref 39.0–52.0)
HEMOGLOBIN: 8.1 g/dL — AB (ref 13.0–17.0)
MCH: 31.6 pg (ref 26.0–34.0)
MCHC: 32.7 g/dL (ref 30.0–36.0)
MCV: 96.9 fL (ref 78.0–100.0)
PLATELETS: 163 10*3/uL (ref 150–400)
RBC: 2.56 MIL/uL — ABNORMAL LOW (ref 4.22–5.81)
RDW: 12.7 % (ref 11.5–15.5)
WBC: 5.1 10*3/uL (ref 4.0–10.5)

## 2016-11-21 LAB — MAGNESIUM: Magnesium: 2.2 mg/dL (ref 1.7–2.4)

## 2016-11-21 MED ORDER — LORAZEPAM 2 MG/ML IJ SOLN
0.5000 mg | Freq: Four times a day (QID) | INTRAMUSCULAR | Status: DC | PRN
  Administered 2016-11-22 – 2016-11-23 (×2): 0.5 mg via INTRAVENOUS
  Filled 2016-11-21 (×2): qty 1

## 2016-11-21 MED ORDER — TAB-A-VITE/IRON PO TABS
1.0000 | ORAL_TABLET | Freq: Every day | ORAL | Status: DC
  Administered 2016-11-21 – 2016-11-27 (×7): 1 via ORAL
  Filled 2016-11-21 (×7): qty 1

## 2016-11-21 MED ORDER — BETHANECHOL CHLORIDE 10 MG PO TABS
10.0000 mg | ORAL_TABLET | Freq: Three times a day (TID) | ORAL | Status: DC
  Administered 2016-11-21 – 2016-11-27 (×19): 10 mg via ORAL
  Filled 2016-11-21 (×20): qty 1

## 2016-11-21 MED ORDER — TAMSULOSIN HCL 0.4 MG PO CAPS
0.4000 mg | ORAL_CAPSULE | Freq: Every day | ORAL | Status: DC
  Administered 2016-11-21 – 2016-11-27 (×7): 0.4 mg via ORAL
  Filled 2016-11-21 (×7): qty 1

## 2016-11-21 MED ORDER — LEVETIRACETAM 500 MG PO TABS
500.0000 mg | ORAL_TABLET | Freq: Two times a day (BID) | ORAL | Status: DC
  Administered 2016-11-21 – 2016-11-27 (×12): 500 mg via ORAL
  Filled 2016-11-21 (×12): qty 1

## 2016-11-21 MED ORDER — FERROUS GLUCONATE 324 (38 FE) MG PO TABS
324.0000 mg | ORAL_TABLET | Freq: Two times a day (BID) | ORAL | Status: DC
  Administered 2016-11-21 – 2016-11-27 (×11): 324 mg via ORAL
  Filled 2016-11-21 (×15): qty 1

## 2016-11-21 NOTE — Progress Notes (Signed)
Trauma Service Note  Subjective: Patient is calm this AM, but I am unsure of what sedation he has received  Objective: Vital signs in last 24 hours: Temp:  [98.2 F (36.8 C)-98.7 F (37.1 C)] 98.5 F (36.9 C) (04/11 0800) Pulse Rate:  [76-118] 76 (04/11 0800) Resp:  [11-20] 13 (04/11 0800) BP: (121-166)/(51-83) 133/68 (04/11 0800) SpO2:  [96 %-100 %] 99 % (04/11 0800) Last BM Date: 11/21/16  Intake/Output from previous day: 04/10 0701 - 04/11 0700 In: 2451.7 [I.V.:2241.7; IV Piggyback:210] Out: 1650 [Urine:1650] Intake/Output this shift: No intake/output data recorded.  General: No distress at thei time .  He is not restrained.  Lungs: Clear to auscultation.  Oxygen saturations 97% on 2L.  Abd: Soft, mildly distended, good bowel sounds.  Extremities: No changes  Neuro: Intact  Lab Results: CBC   Recent Labs  11/21/16 0600  WBC 5.1  HGB 8.1*  HCT 24.8*  PLT 163   BMET  Recent Labs  11/21/16 0600  NA 139  K 3.4*  CL 103  CO2 28  GLUCOSE 97  BUN 10  CREATININE 0.79  CALCIUM 7.7*   PT/INR No results for input(s): LABPROT, INR in the last 72 hours. ABG No results for input(s): PHART, HCO3 in the last 72 hours.  Invalid input(s): PCO2, PO2  Studies/Results: No results found.  Anti-infectives: Anti-infectives    Start     Dose/Rate Route Frequency Ordered Stop   11/18/16 0600  ceFAZolin (ANCEF) IVPB 2g/100 mL premix     2 g 200 mL/hr over 30 Minutes Intravenous On call to O.R. 11/17/16 1216 11/17/16 1335   11/17/16 1830  ceFAZolin (ANCEF) IVPB 2g/100 mL premix     2 g 200 mL/hr over 30 Minutes Intravenous Every 6 hours 11/17/16 1603 11/18/16 0703      Assessment/Plan: s/p Procedure(s): INTRAMEDULLARY (IM) NAIL FEMORAL Advance diet Hemoglobin has dropped a bit  LOS: 4 days   Marta Lamas. Gae Bon, MD, FACS [phone] Trauma Surgeon 11/21/2016
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
## 2016-11-22 ENCOUNTER — Encounter (HOSPITAL_COMMUNITY): Payer: Self-pay | Admitting: General Practice

## 2016-11-22 LAB — CBC
HEMATOCRIT: 28.1 % — AB (ref 39.0–52.0)
HEMOGLOBIN: 9.1 g/dL — AB (ref 13.0–17.0)
MCH: 31.2 pg (ref 26.0–34.0)
MCHC: 32.4 g/dL (ref 30.0–36.0)
MCV: 96.2 fL (ref 78.0–100.0)
Platelets: 206 10*3/uL (ref 150–400)
RBC: 2.92 MIL/uL — ABNORMAL LOW (ref 4.22–5.81)
RDW: 12.8 % (ref 11.5–15.5)
WBC: 6.9 10*3/uL (ref 4.0–10.5)

## 2016-11-22 MED ORDER — PNEUMOCOCCAL VAC POLYVALENT 25 MCG/0.5ML IJ INJ
0.5000 mL | INJECTION | INTRAMUSCULAR | Status: DC
  Filled 2016-11-22 (×3): qty 0.5
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
## 2016-11-22 NOTE — Progress Notes (Signed)
Rehab admissions - I spoke with patient's son by telephone today.  He would like inpatient rehab here at [facility].  Patient has [employer] and was working [employer].  I will open the case and request acute inpatient rehab admission.  I will update all once I hear back from insurance carrier.  Call me for questions.  #[phone]
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
## 2016-11-23 LAB — CBC
HCT: 26.5 % — ABNORMAL LOW (ref 39.0–52.0)
Hemoglobin: 8.7 g/dL — ABNORMAL LOW (ref 13.0–17.0)
MCH: 31.4 pg (ref 26.0–34.0)
MCHC: 32.8 g/dL (ref 30.0–36.0)
MCV: 95.7 fL (ref 78.0–100.0)
Platelets: 213 10*3/uL (ref 150–400)
RBC: 2.77 MIL/uL — ABNORMAL LOW (ref 4.22–5.81)
RDW: 13.1 % (ref 11.5–15.5)
WBC: 5.9 10*3/uL (ref 4.0–10.5)

## 2016-11-23 NOTE — Progress Notes (Signed)
Rehab admissions - I placed another call to insurance carrier, but no response yet.  I continue to await a decision regarding potential acute inpatient rehab admission.  Call me for questions.  #[phone]
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
## 2016-11-24 ENCOUNTER — Encounter (HOSPITAL_COMMUNITY): Payer: Self-pay | Admitting: Orthopedic Surgery

## 2016-11-24 LAB — CBC
HCT: 27.3 % — ABNORMAL LOW (ref 39.0–52.0)
HEMOGLOBIN: 8.8 g/dL — AB (ref 13.0–17.0)
MCH: 31.1 pg (ref 26.0–34.0)
MCHC: 32.2 g/dL (ref 30.0–36.0)
MCV: 96.5 fL (ref 78.0–100.0)
Platelets: 228 10*3/uL (ref 150–400)
RBC: 2.83 MIL/uL — AB (ref 4.22–5.81)
RDW: 13.5 % (ref 11.5–15.5)
WBC: 6.3 10*3/uL (ref 4.0–10.5)
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
## 2016-11-26 MED ORDER — METFORMIN HCL 500 MG PO TABS
500.0000 mg | ORAL_TABLET | Freq: Every day | ORAL | Status: DC
  Administered 2016-11-27: 500 mg via ORAL
  Filled 2016-11-26: qty 1
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
## 2016-11-26 NOTE — Progress Notes (Signed)
Rehab admissions - I spoke with [facility] insurance case manager today.  She tells me she has been assigned the case, but we do not have a decision yet regarding potential inpatient rehab admission.  I will update case manager when I hear back from insurance case manager.  Currently rehab beds are full.  Call me for questions.  #[phone]

## 2016-11-26 NOTE — Progress Notes (Signed)
Inpatient Diabetes Program Recommendations  AACE/ADA: New Consensus Statement on Inpatient Glycemic Control (2015)  Target Ranges:  Prepandial:   less than 140 mg/dL      Peak postprandial:   less than 180 mg/dL (1-2 hours)      Critically ill patients:  140 - 180 mg/dL  Results for JAYSUN, WESSELS (MRN [id number]) as of 11/26/2016 10:55  Ref. Range 11/17/2016 08:05 11/17/2016 08:07 11/18/2016 03:30 11/21/2016 06:00  Glucose Latest Ref Range: 65 - 99 mg/dL 213 (H) 086 (H) 578 (H) 97   Results for RAFFAEL, BUGARIN (MRN [id number]) as of 11/26/2016 10:55  Ref. Range 11/17/2016 07:57 11/17/2016 13:05 11/17/2016 15:54  Glucose-Capillary Latest Ref Range: 65 - 99 mg/dL 413 (H) 244 (H) 010 (H)   Review of Glycemic Control  Diabetes history: DM2 Outpatient Diabetes medications: Metformin 500 mg QAM Current orders for Inpatient glycemic control: None  Inpatient Diabetes Program Recommendations: Oral Agents: Please consider ordering CBGs ACHS and would recommend restarting Metformin 500 mg QAM.   NOTE: Noted consult for Diabetes Coordinator for transitioning patient back to oral DM medication. While inpatient, recommend ordering CBGs ACHS and restarting Metformin 500 mg QAM. Very low risk of hypoglycemia with Metformin regardless of PO intake. Will continue to follow while inpatient.  Thanks, Orlando Penner, RN, MSN, CDE Diabetes Coordinator Inpatient Diabetes Program [phone] (Team Pager from 8am to 5pm)
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
## 2016-11-26 NOTE — Progress Notes (Signed)
Patient requested wallet retrieved from security to give to children. Wallet with contents returned and verified by family.
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
## 2016-11-27 ENCOUNTER — Encounter (HOSPITAL_COMMUNITY): Payer: Self-pay | Admitting: *Deleted

## 2016-11-27 ENCOUNTER — Inpatient Hospital Stay (HOSPITAL_COMMUNITY): Admission: RE | Admit: 2016-11-27 | Payer: 59 | Source: Intra-hospital | Admitting: Physical Medicine & Rehabilitation

## 2016-11-27 ENCOUNTER — Inpatient Hospital Stay (HOSPITAL_COMMUNITY)
Admission: RE | Admit: 2016-11-27 | Discharge: 2016-12-12 | DRG: 949 | Disposition: A | Discharge status: Home Health | Payer: 59 | Source: Intra-hospital | Attending: Physical Medicine & Rehabilitation | Admitting: Physical Medicine & Rehabilitation

## 2016-11-27 ENCOUNTER — Ambulatory Visit: Payer: 59 | Admitting: Physician Assistant

## 2016-11-27 DIAGNOSIS — E119 Type 2 diabetes mellitus without complications: Secondary | ICD-10-CM | POA: Diagnosis not present

## 2016-11-27 DIAGNOSIS — K5900 Constipation, unspecified: Secondary | ICD-10-CM

## 2016-11-27 DIAGNOSIS — F09 Unspecified mental disorder due to known physiological condition: Secondary | ICD-10-CM | POA: Diagnosis not present

## 2016-11-27 DIAGNOSIS — W19XXXS Unspecified fall, sequela: Secondary | ICD-10-CM | POA: Diagnosis not present

## 2016-11-27 DIAGNOSIS — D62 Acute posthemorrhagic anemia: Secondary | ICD-10-CM

## 2016-11-27 DIAGNOSIS — S2231XD Fracture of one rib, right side, subsequent encounter for fracture with routine healing: Secondary | ICD-10-CM | POA: Diagnosis not present

## 2016-11-27 DIAGNOSIS — T402X5A Adverse effect of other opioids, initial encounter: Secondary | ICD-10-CM

## 2016-11-27 DIAGNOSIS — F419 Anxiety disorder, unspecified: Secondary | ICD-10-CM

## 2016-11-27 DIAGNOSIS — S066X2D Traumatic subarachnoid hemorrhage with loss of consciousness of 31 minutes to 59 minutes, subsequent encounter: Secondary | ICD-10-CM | POA: Diagnosis present

## 2016-11-27 DIAGNOSIS — Z7984 Long term (current) use of oral hypoglycemic drugs: Secondary | ICD-10-CM | POA: Diagnosis not present

## 2016-11-27 DIAGNOSIS — R4182 Altered mental status, unspecified: Secondary | ICD-10-CM | POA: Diagnosis not present

## 2016-11-27 DIAGNOSIS — R339 Retention of urine, unspecified: Secondary | ICD-10-CM

## 2016-11-27 DIAGNOSIS — M62838 Other muscle spasm: Secondary | ICD-10-CM | POA: Diagnosis not present

## 2016-11-27 DIAGNOSIS — R4189 Other symptoms and signs involving cognitive functions and awareness: Secondary | ICD-10-CM | POA: Diagnosis not present

## 2016-11-27 DIAGNOSIS — M7989 Other specified soft tissue disorders: Secondary | ICD-10-CM | POA: Diagnosis not present

## 2016-11-27 DIAGNOSIS — Z87820 Personal history of traumatic brain injury: Secondary | ICD-10-CM

## 2016-11-27 DIAGNOSIS — S72301D Unspecified fracture of shaft of right femur, subsequent encounter for closed fracture with routine healing: Secondary | ICD-10-CM | POA: Diagnosis not present

## 2016-11-27 DIAGNOSIS — E113291 Type 2 diabetes mellitus with mild nonproliferative diabetic retinopathy without macular edema, right eye: Secondary | ICD-10-CM

## 2016-11-27 DIAGNOSIS — S7291XA Unspecified fracture of right femur, initial encounter for closed fracture: Secondary | ICD-10-CM

## 2016-11-27 DIAGNOSIS — I10 Essential (primary) hypertension: Secondary | ICD-10-CM | POA: Diagnosis not present

## 2016-11-27 DIAGNOSIS — R911 Solitary pulmonary nodule: Secondary | ICD-10-CM

## 2016-11-27 DIAGNOSIS — Z79899 Other long term (current) drug therapy: Secondary | ICD-10-CM | POA: Diagnosis not present

## 2016-11-27 DIAGNOSIS — E785 Hyperlipidemia, unspecified: Secondary | ICD-10-CM

## 2016-11-27 DIAGNOSIS — R5381 Other malaise: Secondary | ICD-10-CM

## 2016-11-27 DIAGNOSIS — Z8781 Personal history of (healed) traumatic fracture: Secondary | ICD-10-CM

## 2016-11-27 DIAGNOSIS — S06302S Unspecified focal traumatic brain injury with loss of consciousness of 31 minutes to 59 minutes, sequela: Secondary | ICD-10-CM | POA: Diagnosis not present

## 2016-11-27 DIAGNOSIS — R58 Hemorrhage, not elsewhere classified: Secondary | ICD-10-CM

## 2016-11-27 DIAGNOSIS — S069X3S Unspecified intracranial injury with loss of consciousness of 1 hour to 5 hours 59 minutes, sequela: Secondary | ICD-10-CM

## 2016-11-27 DIAGNOSIS — W19XXXA Unspecified fall, initial encounter: Secondary | ICD-10-CM

## 2016-11-27 DIAGNOSIS — R52 Pain, unspecified: Secondary | ICD-10-CM

## 2016-11-27 LAB — CBC
HEMATOCRIT: 28.9 % — AB (ref 39.0–52.0)
Hemoglobin: 9.4 g/dL — ABNORMAL LOW (ref 13.0–17.0)
MCH: 31.4 pg (ref 26.0–34.0)
MCHC: 32.5 g/dL (ref 30.0–36.0)
MCV: 96.7 fL (ref 78.0–100.0)
PLATELETS: 267 10*3/uL (ref 150–400)
RBC: 2.99 MIL/uL — ABNORMAL LOW (ref 4.22–5.81)
RDW: 14.4 % (ref 11.5–15.5)
WBC: 9.3 10*3/uL (ref 4.0–10.5)

## 2016-11-27 LAB — CREATININE, SERUM
Creatinine, Ser: 0.98 mg/dL (ref 0.61–1.24)
GFR calc Af Amer: >60 mL/min (ref >60)
GFR calc non Af Amer: >60 mL/min (ref >60)

## 2016-11-27 LAB — GLUCOSE, CAPILLARY
GLUCOSE-CAPILLARY: 114 mg/dL — AB (ref 65–99)
Glucose-Capillary: 116 mg/dL — ABNORMAL HIGH (ref 65–99)

## 2016-11-27 MED ORDER — SORBITOL 70 % SOLN
30.0000 mL | Freq: Every day | Status: DC | PRN
  Filled 2016-11-27: qty 30

## 2016-11-27 MED ORDER — LEVETIRACETAM 500 MG PO TABS
500.0000 mg | ORAL_TABLET | Freq: Two times a day (BID) | ORAL | Status: DC
  Administered 2016-11-27 – 2016-12-12 (×30): 500 mg via ORAL
  Filled 2016-11-27 (×30): qty 1

## 2016-11-27 MED ORDER — METHOCARBAMOL 500 MG PO TABS
500.0000 mg | ORAL_TABLET | Freq: Four times a day (QID) | ORAL | Status: DC | PRN
  Administered 2016-11-27 – 2016-12-02 (×9): 500 mg via ORAL
  Filled 2016-11-27 (×9): qty 1

## 2016-11-27 MED ORDER — ONDANSETRON HCL 4 MG/2ML IJ SOLN: 4.0000 mg | Freq: Four times a day (QID) | INTRAMUSCULAR | Status: DC | PRN

## 2016-11-27 MED ORDER — BETHANECHOL CHLORIDE 10 MG PO TABS
10.0000 mg | ORAL_TABLET | Freq: Three times a day (TID) | ORAL | Status: DC
  Administered 2016-11-27 – 2016-12-02 (×16): 10 mg via ORAL
  Filled 2016-11-27 (×16): qty 1

## 2016-11-27 MED ORDER — CLONAZEPAM 0.5 MG PO TABS
0.5000 mg | ORAL_TABLET | Freq: Two times a day (BID) | ORAL | Status: DC
  Administered 2016-11-27 – 2016-12-12 (×30): 0.5 mg via ORAL
  Filled 2016-11-27 (×30): qty 1

## 2016-11-27 MED ORDER — LISINOPRIL 20 MG PO TABS: 20.0000 mg | ORAL_TABLET | Freq: Every day | ORAL | Status: DC

## 2016-11-27 MED ORDER — ACETAMINOPHEN 325 MG PO TABS
325.0000 mg | ORAL_TABLET | ORAL | Status: DC | PRN
  Administered 2016-11-30 – 2016-12-01 (×2): 650 mg via ORAL
  Filled 2016-11-27 (×2): qty 2

## 2016-11-27 MED ORDER — ENOXAPARIN SODIUM 40 MG/0.4ML ~~LOC~~ SOLN
40.0000 mg | SUBCUTANEOUS | Status: DC
  Administered 2016-11-27 – 2016-12-11 (×15): 40 mg via SUBCUTANEOUS
  Filled 2016-11-27 (×15): qty 0.4

## 2016-11-27 MED ORDER — ONDANSETRON HCL 4 MG PO TABS: 4.0000 mg | ORAL_TABLET | Freq: Four times a day (QID) | ORAL | Status: DC | PRN

## 2016-11-27 MED ORDER — ENOXAPARIN SODIUM 40 MG/0.4ML ~~LOC~~ SOLN: 40.0000 mg | SUBCUTANEOUS | Status: DC

## 2016-11-27 MED ORDER — OXYCODONE HCL 5 MG PO TABS
5.0000 mg | ORAL_TABLET | ORAL | Status: DC | PRN
  Administered 2016-11-27 – 2016-12-01 (×7): 5 mg via ORAL
  Filled 2016-11-27 (×8): qty 1

## 2016-11-27 MED ORDER — TAMSULOSIN HCL 0.4 MG PO CAPS
0.4000 mg | ORAL_CAPSULE | Freq: Every day | ORAL | Status: DC
  Administered 2016-11-27 – 2016-12-11 (×15): 0.4 mg via ORAL
  Filled 2016-11-27 (×15): qty 1

## 2016-11-27 MED ORDER — METFORMIN HCL 500 MG PO TABS
500.0000 mg | ORAL_TABLET | Freq: Every day | ORAL | Status: DC
  Administered 2016-11-28 – 2016-12-12 (×15): 500 mg via ORAL
  Filled 2016-11-27 (×15): qty 1

## 2016-11-27 NOTE — Progress Notes (Signed)
Patient ID: Jerry Zimmerman, male   DOB: August 27, 1940, 76 y.o.   MRN: [id number] Patient arrived from [facility] with RN, family, and patient belongings. Patient and family oriented to room, nurse call system, fall prevention plan, rehab safety plan, rehab schedule, health resource notebook, and rehab process. Patient had complaints of pain in RLE, pain meds given when access to chart was available.
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
## 2016-11-27 NOTE — Progress Notes (Signed)
Patient looking better and should be able to go to Rehab soon.  Will contact Rehab service.  This patient has been seen and I agree with the findings and treatment plan.  Marta Lamas. Gae Bon, MD, FACS [phone] (pager) [phone] (direct pager) Trauma Surgeon

## 2016-11-27 NOTE — Progress Notes (Signed)
Rehab admissions - I met with patient and his daughter.  They are in agreement to inpatient rehab admission.  I answered a lot of questions and explained benefits to them.  Bed available and will admit to acute inpatient rehab today.  Call me for questions.  #[phone]
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
## 2016-11-28 ENCOUNTER — Inpatient Hospital Stay (HOSPITAL_COMMUNITY): Payer: Medicare Other | Admitting: Physical Therapy

## 2016-11-28 ENCOUNTER — Inpatient Hospital Stay (HOSPITAL_COMMUNITY): Payer: 59

## 2016-11-28 ENCOUNTER — Inpatient Hospital Stay (HOSPITAL_COMMUNITY): Payer: Medicare Other | Admitting: Speech Pathology

## 2016-11-28 ENCOUNTER — Inpatient Hospital Stay (HOSPITAL_COMMUNITY): Payer: Medicare Other | Admitting: Occupational Therapy

## 2016-11-28 DIAGNOSIS — S72301D Unspecified fracture of shaft of right femur, subsequent encounter for closed fracture with routine healing: Secondary | ICD-10-CM

## 2016-11-28 DIAGNOSIS — M7989 Other specified soft tissue disorders: Secondary | ICD-10-CM

## 2016-11-28 DIAGNOSIS — S06302S Unspecified focal traumatic brain injury with loss of consciousness of 31 minutes to 59 minutes, sequela: Secondary | ICD-10-CM

## 2016-11-28 LAB — CBC WITH DIFFERENTIAL/PLATELET
BASOS ABS: 0 10*3/uL (ref 0.0–0.1)
BASOS PCT: 0 %
EOS PCT: 2 %
Eosinophils Absolute: 0.2 10*3/uL (ref 0.0–0.7)
HEMATOCRIT: 28.4 % — AB (ref 39.0–52.0)
Hemoglobin: 9.4 g/dL — ABNORMAL LOW (ref 13.0–17.0)
LYMPHS PCT: 16 %
Lymphs Abs: 1.4 10*3/uL (ref 0.7–4.0)
MCH: 32.2 pg (ref 26.0–34.0)
MCHC: 33.1 g/dL (ref 30.0–36.0)
MCV: 97.3 fL (ref 78.0–100.0)
MONO ABS: 0.4 10*3/uL (ref 0.1–1.0)
MONOS PCT: 5 %
NEUTROS ABS: 6.8 10*3/uL (ref 1.7–7.7)
Neutrophils Relative %: 77 %
PLATELETS: 257 10*3/uL (ref 150–400)
RBC: 2.92 MIL/uL — ABNORMAL LOW (ref 4.22–5.81)
RDW: 14.5 % (ref 11.5–15.5)
WBC: 8.8 10*3/uL (ref 4.0–10.5)

## 2016-11-28 LAB — COMPREHENSIVE METABOLIC PANEL
ALBUMIN: 2.7 g/dL — AB (ref 3.5–5.0)
ALT: 27 U/L (ref 17–63)
ANION GAP: 8 (ref 5–15)
AST: 29 U/L (ref 15–41)
Alkaline Phosphatase: 86 U/L (ref 38–126)
BILIRUBIN TOTAL: 2 mg/dL — AB (ref 0.3–1.2)
BUN: 13 mg/dL (ref 6–20)
CALCIUM: 8.2 mg/dL — AB (ref 8.9–10.3)
CO2: 24 mmol/L (ref 22–32)
Chloride: 101 mmol/L (ref 101–111)
Creatinine, Ser: 0.94 mg/dL (ref 0.61–1.24)
GFR calc Af Amer: >60 mL/min (ref >60)
GFR calc non Af Amer: >60 mL/min (ref >60)
GLUCOSE: 124 mg/dL — AB (ref 65–99)
Potassium: 3.5 mmol/L (ref 3.5–5.1)
Sodium: 133 mmol/L — ABNORMAL LOW (ref 135–145)
TOTAL PROTEIN: 6.3 g/dL — AB (ref 6.5–8.1)

## 2016-11-28 LAB — GLUCOSE, CAPILLARY
GLUCOSE-CAPILLARY: 117 mg/dL — AB (ref 65–99)
GLUCOSE-CAPILLARY: 173 mg/dL — AB (ref 65–99)
Glucose-Capillary: 162 mg/dL — ABNORMAL HIGH (ref 65–99)
Glucose-Capillary: 128 mg/dL — ABNORMAL HIGH (ref 65–99)
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
## 2016-11-28 NOTE — Progress Notes (Signed)
*  PRELIMINARY RESULTS* Vascular Ultrasound Lower extremity venous duplex has been completed.  Preliminary findings: No evidence of DVT or baker's cyst.    Farrel Demark, RDMS, RVT  11/28/2016, 11:20 AM
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
## 2016-11-28 NOTE — Progress Notes (Signed)
Physical Therapy Session Note  Patient Details  Name: Jerry Zimmerman MRN: [id number] Date of Birth: [birth year]  Today's Date: 11/28/2016 PT Individual Time: 4098-1191, 4782-9562 PT Individual Time Calculation (min): 35 min, 31 min  Short Term Goals: Week 1:     Skilled Therapeutic Interventions/Progress Updates:    Session 1: Pt in bed upon arrival, just returning from test performed off unit. Pt willing to participate in PT session. Supine>sitting performed with HOB elevated, min assist and use of rail. Pt able to sit EOB with UE support. Requesting to eat rest of breakfast while sitting. No LOB while eating. Sit<>stand performed using rw, cues for hand placement and sequence, LOB posteriorly while turning with assist to recover. W/C mobility X100 ft and min assist no flat/level. W/C<>car transfers with min assist and cues. Pt needing assist with lifting Rt LE into car. Pt transported back to room after session, up in w/c with daughter present and all needs in reach.   Session 2: Pt in bed sleeping upon arrival, easily awoken and agreeable to PT. Bed Mobility: supine>sitting, min assist to come to sitting, using rail for assistance. Transfers: sit<>stand performed with min assist using rw, cues for hand placement with decreasing cues as session progressed. Ambulation performed on uneven surface with min assist and slow guarded pattern, up/down 3", 6" steps X1 each using bilateral UEs and mod assist. Cues needed for sequence using steps. Due to time constraints, total assist via w/c back to room, daughter present and all needs in reach.  Therapy Documentation Precautions:    General: PT Amount of Missed Time (min): 25 Minutes PT Missed Treatment Reason: Unavailable (Comment) (Pt off floor at test. )  Pain:  Indicates mild pain in Rt LE, monitored during session.   See Function Navigator for Current Functional Status.   Therapy/Group: Individual Therapy  Delton See, PT 11/28/2016, 12:29  PM

## 2016-11-28 NOTE — Plan of Care (Signed)
Problem: RH Bed to Chair Transfers Goal: LTG Patient will perform bed/chair transfers w/assist (PT) LTG: Patient will perform bed/chair transfers with assistance, with/without cues (PT). With LRAD  Problem: RH Car Transfers Goal: LTG Patient will perform car transfers with assist (PT) LTG: Patient will perform car transfers with assistance (PT). With LRAD  Problem: RH Furniture Transfers Goal: LTG Patient will perform furniture transfers w/assist (OT/PT LTG: Patient will perform furniture transfers  with assistance (OT/PT). With LRAD  Problem: RH Ambulation Goal: LTG Patient will ambulate in controlled environment (PT) LTG: Patient will ambulate in a controlled environment, # of feet with assistance (PT). 150 ft with LRAD Goal: LTG Patient will ambulate in home environment (PT) LTG: Patient will ambulate in home environment, # of feet with assistance (PT). 50 ft with LRAD  Problem: RH Stairs Goal: LTG Patient will ambulate up and down stairs w/assist (PT) LTG: Patient will ambulate up and down # of stairs with assistance (PT) 15 steps with R rail for home access
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
## 2016-11-28 NOTE — Progress Notes (Signed)
Patient information reviewed and entered into eRehab system by Jaleen Finch, RN, CRRN, PPS Coordinator.  Information including medical coding and functional independence measure will be reviewed and updated through discharge.     Per nursing patient was given "Data Collection Information Summary for Patients in Inpatient Rehabilitation Facilities with attached "Privacy Act Statement-Health Care Records" upon admission.  

## 2016-11-29 ENCOUNTER — Inpatient Hospital Stay (HOSPITAL_COMMUNITY): Payer: 59

## 2016-11-29 ENCOUNTER — Inpatient Hospital Stay (HOSPITAL_COMMUNITY): Payer: 59 | Admitting: Speech Pathology

## 2016-11-29 ENCOUNTER — Inpatient Hospital Stay (HOSPITAL_COMMUNITY): Payer: 59 | Admitting: *Deleted

## 2016-11-29 ENCOUNTER — Inpatient Hospital Stay (HOSPITAL_COMMUNITY): Payer: 59 | Admitting: Occupational Therapy

## 2016-11-29 ENCOUNTER — Encounter (HOSPITAL_COMMUNITY): Payer: Self-pay | Admitting: *Deleted

## 2016-11-29 LAB — GLUCOSE, CAPILLARY
GLUCOSE-CAPILLARY: 147 mg/dL — AB (ref 65–99)
GLUCOSE-CAPILLARY: 150 mg/dL — AB (ref 65–99)
Glucose-Capillary: 123 mg/dL — ABNORMAL HIGH (ref 65–99)
Glucose-Capillary: 165 mg/dL — ABNORMAL HIGH (ref 65–99)

## 2016-11-29 MED ORDER — DICLOFENAC SODIUM 1 % TD GEL
2.0000 g | Freq: Four times a day (QID) | TRANSDERMAL | Status: DC
  Administered 2016-11-29 – 2016-12-12 (×40): 2 g via TOPICAL
  Filled 2016-11-29 (×2): qty 100

## 2016-11-29 NOTE — Progress Notes (Signed)
Speech Language Pathology Daily Session Note  Patient Details  Name: Jerry Zimmerman MRN: [id number] Date of Birth: Sep 19, 1940  Today's Date: 11/29/2016 SLP Individual Time: 0830-0930 SLP Individual Time Calculation (min): 60 min  Short Term Goals: Week 1: SLP Short Term Goal 1 (Week 1): Patient will demonstrate functional problem solving for basic and familair tasks with Min A verbal cues.  SLP Short Term Goal 2 (Week 1): Patient will recall new, daily information with Min A verbal cues.  SLP Short Term Goal 3 (Week 1): Patient will demonstrate emergent awareness into difficulty of task and self-monitor and correct errors with Min A verbal cues.  SLP Short Term Goal 4 (Week 1): Patient will self-monitor and correct verbosity with Mod A verbal cues.   Skilled Therapeutic Interventions: Skilled treatment session focused on cognitive goals. Patient independently recalled clinician and planned task from previous day. Patient was administered the MoCA-Version 7.1 and scored 17/30 points with deficits in the areas of attention and short-term recall. Also suspect function was impacted by language barrier. Patient demonstrated intermittent verbosity throughout session but was redirected with Mod A verbal cues. Patient left upright in bed with all needs within reach. Continue with current plan of care.   Function:    Cognition Comprehension Comprehension assist level: Understands basic 90% of the time/cues < 10% of the time  Expression   Expression assist level: Expresses basic 90% of the time/requires cueing < 10% of the time.  Social Interaction Social Interaction assist level: Interacts appropriately 50 - 74% of the time - May be physically or verbally inappropriate.  Problem Solving Problem solving assist level: Solves basic 50 - 74% of the time/requires cueing 25 - 49% of the time  Memory Memory assist level: Recognizes or recalls 75 - 89% of the time/requires cueing 10 - 24% of the time     Pain No/Denies Pain   Therapy/Group: Individual Therapy  Vanisha Whiten 11/29/2016, 3:08 PM

## 2016-11-29 NOTE — Progress Notes (Signed)
Occupational Therapy Session Note  Patient Details  Name: Jerry Zimmerman MRN: [id number] Date of Birth: Jan 17, 1941  Today's Date: 11/29/2016 OT Individual Time: 7829-5621 and 1530-1600 OT Individual Time Calculation (min): 56 min and 30 min    Short Term Goals: Week 1:  OT Short Term Goal 1 (Week 1): Pt will engaged in 10 minutes of functional tasks with 2 rest breaks or less of 1 minute each.  OT Short Term Goal 2 (Week 1): Pt will perform bathing at shower level with min A in order to increase indepence with self care. OT Short Term Goal 3 (Week 1): Pt will perform shower transfer with min A.  OT Short Term Goal 4 (Week 1): Pt will perform toilet transfer with min A.   Skilled Therapeutic Interventions/Progress Updates:    Session 1:Upon entering the room, pt supine in bed with 5/10 c/o soreness in R hip. RN notified. Pt washed face with set up A. Pt declined all other self care this session. Pt was agreeable to B UE strengthening exercises. OT demonstrated use of level 1 theraband and pt returning demonstrations with min verbal cues for shoulder elevation, shoulder diagonals, bicep curls, alternating punches, and bicep curls 2 sets of 10 each. Pt needing rest breaks secondary to fatigue. Pt needing min cues to attend to task due to being overly verbose this session. Breakfast arrived and pt set up to eat with prior to OT exiting the room. Call bell and all needed items within reach.   Session 2: Upon entering the room, pt supine in bed with family present in room. Pt performed supine >sit with min A for trunk . Pt engaged in stand without use of B UEs and controlled sit x 8 reps. Pt with min verbal cues for anterior weight shift and needing min lifting assistance to stand from bed with B LE strength only. Controlled sit to bed without use of UEs with overall supervision and done very controlled. Pt standing for 90 seconds without use of B UE's and having multiple LOB's posterior with pt unable to  self correct. Pt returned to supine per pt request at end of session with assistance min A for R LE into bed from sit >supine. Call bell and all needed items within reach upon exiting the room.   Therapy Documentation Precautions:  Precautions Precautions: Fall Precaution Comments: R SAH Restrictions Weight Bearing Restrictions: Yes RLE Weight Bearing: Weight bearing as tolerated Other Position/Activity Restrictions: 2/2 IM nail 11/17/16 General:   Vital Signs: Therapy Vitals Temp: 98.5 F (36.9 C) Temp Source: Oral Pulse Rate: 85 Resp: 18 BP: (!) 153/79 Patient Position (if appropriate): Lying Oxygen Therapy SpO2: 95 % O2 Device: Not Delivered Pain: Pain Assessment Pain Assessment: Faces Faces Pain Scale: Hurts little more Pain Type: Acute pain;Surgical pain Pain Location: Leg Pain Orientation: Right Pain Descriptors / Indicators: Aching;Discomfort Pain Frequency: Intermittent Pain Onset: Gradual Patients Stated Pain Goal: 2 Pain Intervention(s): Medication (See eMAR);Repositioned ADL:   Vision/Perception     Exercises:   Other Treatments:    See Function Navigator for Current Functional Status.   Therapy/Group: Individual Therapy  Jerry Zimmerman 11/29/2016, 7:59 AM

## 2016-11-29 NOTE — Progress Notes (Signed)
Physical Therapy Session Note  Patient Details  Name: Jerry Zimmerman MRN: [id number] Date of Birth: May 14, 1941  Today's Date: 11/29/2016 PT Individual Time: 4098-1191 and 1340-1409  PT Individual Time Calculation (min): 59 min and  Short Term Goals: Week 1:  PT Short Term Goal 1 (Week 1): Pt will perform all bed mobility tasks with supervision.  PT Short Term Goal 2 (Week 1): Pt will negotiate 12 steps with B rails and supervision assist. PT Short Term Goal 3 (Week 1): Pt will complete bed<>w/c transfers with supervision assist.  Skilled Therapeutic Interventions/Progress Updates:    Tx focused on functional mobility training, gait with RW, stairs, and NMR via cognitive remediation and functional balance tasks.  Pt resting in bed upon arrival. Agreeable to PT once explained purpose of his therapy program here. No c/o pain.  Supine>sit with S cues for techniqe. Dynamicsitting balance EOB x5 min with close S and safety cues.  Sit<>stand with min/Mod lifting assist x4 with cues for technique and safe brake use of WC.  Gait 1x15' and 1x115' with RW and min-guard A and cues for RW management, safey, and posture.Gait notable for decreased RLE weight bearing.  Pt exhausted folllowing 2nd walk, needing 3 min rest to continue.  Stair training x 8 with 2 rails and min A overall, safety cues. Pt left up in [facility] for next therapy and daughter arrived.   Second tx. Pt sleeping in bed upon arrival, declining any tx due to fatigue at this time. Pt also concerned with new pain at right tibial head. Discussed with RN and noted negative DVT dopplers from yesterday. Pt educated on likelihood of MS pain from hard work this morning, and offered ice. RN aware and coming to assess.  Pt instructed in supine therex including each of the following 2x10: ankel pumps, heel slides, hip ABD/ADD and bridging. Pt declining any OOB tx this afternoon.   Pt left up in bed with all needs in reach.    Second tx Therapy  Documentation Precautions:  Precautions Precautions: Fall Precaution Comments: R SAH Restrictions Weight Bearing Restrictions: Yes RLE Weight Bearing: Weight bearing as tolerated Other Position/Activity Restrictions: 2/2 IM nail 11/17/16   Pain: none   See Function Navigator for Current Functional Status.   Therapy/Group: Individual Therapy  Amenda Duclos, Chrisandra Netters, PT, DPT  11/29/2016, 10:39 AM

## 2016-11-29 NOTE — Progress Notes (Signed)
Occupational Therapy Note  Patient Details  Name: Mavis Fichera MRN: [id number] Date of Birth: [birth year]  Today's Date: 11/29/2016 OT Individual Time: 1100-1130 OT Individual Time Calculation (min): 30 min   Pt c/o increased pain in RLE (unrated); RN aware and repositioned Individual therapy  Pt resting in w/c upon arrival with daughter present.  Focus on sit<>stand, standing balance, functional amb with HHA, safety awareness, and ongoing discharge planning.  Pt required min A for sit<>stand and standing balance.  Pt required mod A for amb approx 10' with HHA.  Pt states that pain in RLE prohibits further ambulation.  Pt demonstrated BUE therex with theraband learned in earlier OT session.  Pt remained in w/c with all needs within reach and daughter present.   Lavone Neri [facility] 11/29/2016, 12:11 PM
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
## 2016-11-30 ENCOUNTER — Inpatient Hospital Stay (HOSPITAL_COMMUNITY): Payer: 59 | Admitting: Physical Therapy

## 2016-11-30 ENCOUNTER — Inpatient Hospital Stay (HOSPITAL_COMMUNITY): Payer: 59

## 2016-11-30 ENCOUNTER — Inpatient Hospital Stay (HOSPITAL_COMMUNITY): Payer: 59 | Admitting: Occupational Therapy

## 2016-11-30 ENCOUNTER — Inpatient Hospital Stay (HOSPITAL_COMMUNITY): Payer: 59 | Admitting: Speech Pathology

## 2016-11-30 DIAGNOSIS — W19XXXS Unspecified fall, sequela: Secondary | ICD-10-CM

## 2016-11-30 LAB — CBC WITH DIFFERENTIAL/PLATELET
BASOS PCT: 0 %
Basophils Absolute: 0 10*3/uL (ref 0.0–0.1)
Eosinophils Absolute: 0.1 10*3/uL (ref 0.0–0.7)
Eosinophils Relative: 1 %
HEMATOCRIT: 27.8 % — AB (ref 39.0–52.0)
HEMOGLOBIN: 8.9 g/dL — AB (ref 13.0–17.0)
Lymphocytes Relative: 4 %
Lymphs Abs: 0.4 10*3/uL — ABNORMAL LOW (ref 0.7–4.0)
MCH: 30.8 pg (ref 26.0–34.0)
MCHC: 32 g/dL (ref 30.0–36.0)
MCV: 96.2 fL (ref 78.0–100.0)
Monocytes Absolute: 1 10*3/uL (ref 0.1–1.0)
Monocytes Relative: 10 %
NEUTROS ABS: 7.9 10*3/uL — AB (ref 1.7–7.7)
NEUTROS PCT: 85 %
Platelets: 318 10*3/uL (ref 150–400)
RBC: 2.89 MIL/uL — AB (ref 4.22–5.81)
RDW: 14.1 % (ref 11.5–15.5)
WBC: 9.3 10*3/uL (ref 4.0–10.5)

## 2016-11-30 LAB — URINALYSIS, ROUTINE W REFLEX MICROSCOPIC
Bilirubin Urine: NEGATIVE
GLUCOSE, UA: 50 mg/dL — AB
HGB URINE DIPSTICK: NEGATIVE
Ketones, ur: NEGATIVE mg/dL
Nitrite: NEGATIVE
Protein, ur: NEGATIVE mg/dL
Specific Gravity, Urine: 1.016 (ref 1.005–1.030)
Squamous Epithelial / LPF: NONE SEEN
pH: 6 (ref 5.0–8.0)

## 2016-11-30 LAB — BASIC METABOLIC PANEL
Anion gap: 10 (ref 5–15)
BUN: 13 mg/dL (ref 6–20)
CHLORIDE: 99 mmol/L — AB (ref 101–111)
CO2: 23 mmol/L (ref 22–32)
CREATININE: 0.91 mg/dL (ref 0.61–1.24)
Calcium: 8.4 mg/dL — ABNORMAL LOW (ref 8.9–10.3)
GFR calc non Af Amer: >60 mL/min (ref >60)
Glucose, Bld: 153 mg/dL — ABNORMAL HIGH (ref 65–99)
POTASSIUM: 3.8 mmol/L (ref 3.5–5.1)
SODIUM: 132 mmol/L — AB (ref 135–145)

## 2016-11-30 LAB — GLUCOSE, CAPILLARY
GLUCOSE-CAPILLARY: 135 mg/dL — AB (ref 65–99)
GLUCOSE-CAPILLARY: 157 mg/dL — AB (ref 65–99)
GLUCOSE-CAPILLARY: 157 mg/dL — AB (ref 65–99)
Glucose-Capillary: 159 mg/dL — ABNORMAL HIGH (ref 65–99)

## 2016-11-30 NOTE — Progress Notes (Signed)
Pt found on floor of his room at 0345, pt had tried to get up by himself to use the bathroom and he was found sitting with his back against the left side of the bed, right brow area noted to be bleeding and small superficial cut to right cheek, right side of head with a linear abrasion from forehead to back of his head within his scalp,  pt does not recall if he hit his head or how he was on the floor.  Pt is alert and oriented x2-3, unsure of time but knew it was "night", remembered I was his "night nurse", did not remember he was in [facility] but then remembers he was in [facility] downtown. Pt baseline is alert/oriented x4.   CBG 135.  VSS.  Mechanical lift used to put pt back in bed after pt was able to move all limbs without complaint.  Denies problem to right hip/surgical area, denies pain greater than "usual" pain.  Refuses need for pain medicine.  Neuro check noted to be at pt's baseline, no n/v, call to Jacalyn Lefevre, NP on call at 0420, CT head, cbc and bmp ordered.  Post fall assessment vs see flowsheet. Pt son, [name], called at [phone], vm box is full but left sms with RN cell phone number and recalled pt son at [phone] but again, he did not answer.  Accompanied pt to CT, pt tolerated well.

## 2016-11-30 NOTE — Progress Notes (Signed)
Speech Language Pathology Daily Session Note  Patient Details  Name: Jerry Zimmerman MRN: [id number] Date of Birth: [birth year]  Today's Date: 11/30/2016 SLP Individual Time: 0930-1000 SLP Individual Time Calculation (min): 30 min  Short Term Goals: Week 1: SLP Short Term Goal 1 (Week 1): Patient will demonstrate functional problem solving for basic and familair tasks with Min A verbal cues.  SLP Short Term Goal 2 (Week 1): Patient will recall new, daily information with Min A verbal cues.  SLP Short Term Goal 3 (Week 1): Patient will demonstrate emergent awareness into difficulty of task and self-monitor and correct errors with Min A verbal cues.  SLP Short Term Goal 4 (Week 1): Patient will self-monitor and correct verbosity with Mod A verbal cues.   Skilled Therapeutic Interventions: Treatment targeted functional problem solving tasks, recall of information, and emergent awareness. Pt demonstrated difficulty with problem solving during coin/money activity and required mod-max verbal/visual (written) cueing in order to attend to tasks and formulate accurate response (may have been due to internal distractions/new right shoulder pain). He was able to accurately recall the story of his recent fall, however required mod cues to recall activities from prior speech therapy session. Pt exhibited limited emergent awareness during coin activity-he experienced difficulty producing the correct amount of change, but was relatively unaware of the significance of his deficits.    Function:  Eating Eating                 Cognition Comprehension Comprehension assist level: Understands basic 90% of the time/cues < 10% of the time  Expression   Expression assist level: Expresses complex 90% of the time/cues < 10% of the time  Social Interaction Social Interaction assist level: Interacts appropriately 90% of the time - Needs monitoring or encouragement for participation or interaction.  Problem Solving  Problem solving assist level: Solves basic 50 - 74% of the time/requires cueing 25 - 49% of the time  Memory Memory assist level: Recognizes or recalls 50 - 74% of the time/requires cueing 25 - 49% of the time    Pain Pain Assessment Pain Score: 10-Worst pain ever Pain Location: Shoulder Pain Orientation: Right Pain Intervention(s): RN made aware  Therapy/Group: Individual Therapy  Royce Macadamia 11/30/2016, 3:52 PM

## 2016-11-30 NOTE — Progress Notes (Addendum)
Physical Therapy Session Note  Patient Details  Name: Jerry Zimmerman MRN: [id number] Date of Birth: [birth year]  Today's Date: 11/30/2016 PT Individual Time: 1415-1445 PT Individual Time Calculation (min): 30 min    Skilled Therapeutic Interventions/Progress Updates:    Therapist arrived with pt in bed.  Pt reports 8/10 pain in R leg and 7/10 pain in R shoulder.  Nursing provided pain meds.  Therapy session focused on improving mobility since fall last night.  Pt performed supine there ex to include:  Ankle pumps for 2 x 10, heel slides (hip flexion), and supine hip abduction for 1 x 10 b/l.  Pt performed supine to sit with +2 assist with HOB elevated.  Performed sit to stand with mod A x 1 with bed elevated.  Transferred to recliner with min assist and RW.  Pt left in chair with quick release belt in place and lap belt donned.  Family and social worker present in room.  Therapy Documentation Precautions:  Precautions Precautions: Fall Precaution Comments: R SAH Restrictions Weight Bearing Restrictions: Yes RLE Weight Bearing: Weight bearing as tolerated Other Position/Activity Restrictions: 2/2 IM nail 11/17/16   See Function Navigator for Current Functional Status.   Therapy/Group: Individual Therapy  [name] 11/30/2016, 3:11 PM

## 2016-11-30 NOTE — Progress Notes (Signed)
Occupational Therapy Session Note  Patient Details  Name: Jerry Zimmerman MRN: [id number] Date of Birth: [birth year]  Today's Date: 11/30/2016 OT Individual Time: 4098-1191 OT Individual Time Calculation (min): 21 min  and Today's Date: 11/30/2016 OT Missed Time: 39 Minutes Missed Time Reason: Patient unwilling/refused to participate without medical reason   Short Term Goals: Week 1:  OT Short Term Goal 1 (Week 1): Pt will engaged in 10 minutes of functional tasks with 2 rest breaks or less of 1 minute each.  OT Short Term Goal 2 (Week 1): Pt will perform bathing at shower level with min A in order to increase indepence with self care. OT Short Term Goal 3 (Week 1): Pt will perform shower transfer with min A.  OT Short Term Goal 4 (Week 1): Pt will perform toilet transfer with min A.   Skilled Therapeutic Interventions/Progress Updates:    Upon entering the room, pt supine in bed with c/o R hip pain but unable to describe. Pt appears to be more confused this session. He needing max cues for orientation to time, location, and situation this session. OT encouraging pt to attempt OT intervention but he continues to decline secondary to lack of sleep and pain. Pt remained in bed with bed alarm activated and call bell within reach.   Therapy Documentation Precautions:  Precautions Precautions: Fall Precaution Comments: R SAH Restrictions Weight Bearing Restrictions: Yes RLE Weight Bearing: Weight bearing as tolerated Other Position/Activity Restrictions: 2/2 IM nail 11/17/16 General: General OT Amount of Missed Time: 39 Minutes Vital Signs: Therapy Vitals Pulse Rate: 86 Resp: 18 BP: (!) 138/58 Patient Position (if appropriate): Lying Oxygen Therapy SpO2: 96 % O2 Device: Not Delivered  See Function Navigator for Current Functional Status.   Therapy/Group: Individual Therapy  Alen Bleacher 11/30/2016, 8:22 AM

## 2016-11-30 NOTE — Progress Notes (Signed)
Speech Language Pathology Daily Session Note  Patient Details  Name: Jerry Zimmerman MRN: [id number] Date of Birth: 1941-03-02  Today's Date: 11/30/2016 SLP Individual Time: 0930-1000 SLP Individual Time Calculation (min): 30 min  Short Term Goals: Week 1: SLP Short Term Goal 1 (Week 1): Patient will demonstrate functional problem solving for basic and familair tasks with Min A verbal cues.  SLP Short Term Goal 2 (Week 1): Patient will recall new, daily information with Min A verbal cues.  SLP Short Term Goal 3 (Week 1): Patient will demonstrate emergent awareness into difficulty of task and self-monitor and correct errors with Min A verbal cues.  SLP Short Term Goal 4 (Week 1): Patient will self-monitor and correct verbosity with Mod A verbal cues.   Skilled Therapeutic Interventions: Skilled treatment session focused on cognitive goals. Upon arrival, patient reported pain in his right shoulder, lowe back and right side of forehead, PA and physician aware but agreeable to participate in treatment session. Patient required extra time and Mod A verbal cues for functional problem solving for basic money management task. Patient left supine in bed with all needs within reach. Continue with current plan of care.      Function:  Cognition Comprehension Comprehension assist level: Understands basic 90% of the time/cues < 10% of the time  Expression   Expression assist level: Expresses basic 90% of the time/requires cueing < 10% of the time.  Social Interaction Social Interaction assist level: Interacts appropriately 50 - 74% of the time - May be physically or verbally inappropriate.  Problem Solving Problem solving assist level: Solves basic 50 - 74% of the time/requires cueing 25 - 49% of the time  Memory Memory assist level: Recognizes or recalls 75 - 89% of the time/requires cueing 10 - 24% of the time    Pain See above, unable to rate.   Therapy/Group: Individual Therapy  Muslima Toppins,  Layken Doenges 11/30/2016, 3:15 PM
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
## 2016-11-30 NOTE — IPOC Note (Signed)
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, sequela (HCC) Active Problems:   Essential hypertension, benign   Closed traumatic nondisplaced fracture of shaft of right femur with routine healing     Functional Problem List: Nursing Edema, Endurance, Medication Management, Pain, Safety, Skin Integrity  PT Balance, Behavior, Endurance, Motor, Pain, Safety, Sensory, Perception  OT Balance, Cognition, Endurance, Motor, Pain, Safety  SLP Cognition  TR         Basic ADL's: OT Grooming, Bathing, Dressing, Toileting     Advanced  ADL's: OT       Transfers: PT Bed to Chair, Bed Mobility, Car, Occupational psychologist, Research scientist (life sciences): PT Ambulation, Psychologist, prison and probation services, Stairs     Additional Impairments: OT None  SLP Social Cognition   Social Interaction, Memory, Attention, Problem Solving, Awareness  TR      Anticipated Outcomes Item Anticipated Outcome  Self Feeding n/a  Swallowing      Basic self-care  supervision overall  Toileting  supervision overall   Bathroom Transfers supervision overall  Bowel/Bladder  Supervision  Transfers  supervision/mod I with LRAD  Locomotion  supervision with LRAD  Communication     Cognition  Supervision   Pain  <4 on a 0-10 pain scale  Safety/Judgment  min assist with calling for assistance, min assist with RLE-WBAT   Therapy Plan: PT Intensity: Minimum of 1-2 x/day ,45 to 90 minutes PT Frequency: 5 out of 7 days PT Duration Estimated Length of Stay: 10-14 days OT Intensity: Minimum of 1-2 x/day, 45 to 90 minutes OT Frequency: 5 out of 7 days OT Duration/Estimated Length of Stay: 10-14 days SLP Intensity: Minumum of 1-2 x/day, 30 to 90 minutes SLP Frequency: 3 to 5 out of 7 days SLP Duration/Estimated Length  of Stay: 10-14 days        Team Interventions: Nursing Interventions Patient/Family Education, Pain Management, Medication Management, Skin Care/Wound Management, Discharge Planning, Psychosocial Support  PT interventions Ambulation/gait training, Community reintegration, DME/adaptive equipment instruction, Neuromuscular re-education, Psychosocial support, Stair training, UE/LE Strength taining/ROM, Wheelchair propulsion/positioning, UE/LE Coordination activities, Therapeutic Activities, Skin care/wound management, Pain management, Discharge planning, Warden/ranger, Cognitive remediation/compensation, Disease management/prevention, Functional mobility training, Patient/family education, Therapeutic Exercise, Visual/perceptual remediation/compensation  OT Interventions Balance/vestibular training, Cognitive remediation/compensation, Neuromuscular re-education, Self Care/advanced ADL retraining, Therapeutic Exercise, Wheelchair propulsion/positioning, UE/LE Strength taining/ROM, Skin care/wound managment, Pain management, DME/adaptive equipment instruction, Community reintegration, Equities trader education, UE/LE Coordination activities, Discharge planning, Functional mobility training, Psychosocial support, Therapeutic Activities  SLP Interventions Cognitive remediation/compensation, Financial trader, Functional tasks, Environmental controls, Patient/family education, Therapeutic Activities  TR Interventions    SW/CM Interventions Discharge Planning, Psychosocial Support, Patient/Family Education    Team Discharge Planning: Destination: PT-Home ,OT- Home , SLP-Home Projected Follow-up: PT-Outpatient PT, OT-  Outpatient OT, SLP-24 hour supervision/assistance, Home Health SLP, Outpatient SLP Projected Equipment Needs: PT-To be determined, OT- To be determined, SLP-None recommended by SLP Equipment Details: PT- , OT-  Patient/family involved in discharge planning: PT- Patient, Family  member/caregiver,  OT-Patient, Family member/caregiver, SLP-Patient, Family member/caregiver  MD ELOS: 10-14 days Medical Rehab Prognosis:  Excellent Assessment: The patient has been admitted for CIR therapies with the diagnosis of TBI/polytrauma. The team will be addressing functional mobility, strength, stamina, balance, safety, adaptive techniques and equipment, self-care, bowel and bladder mgt, patient and caregiver education,  NMR, pain control, ortho precautions, community reintegration, cognition, communication. Goals have been set at supervision for mobility and self-care as well as cognition. Course complicated this morning by unassisted fall.    [name], MD, FAAPMR      See Team Conference Notes for weekly updates to the plan of care

## 2016-11-30 NOTE — Progress Notes (Signed)
   Assessment / Plan: Approximately 2 weeks S/P IM Nail Right Femur for Fracutre by Dr. Jewel Baize. 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  XR 11/29/16 shows stable alignment of repair.    Fall overnight without new or worsening leg pain - would re-xray if he has new pain in his leg when walking.  Continue to mobilize with therapy.  Weight Bearing: Weight Bearing as Tolerated (WBAT)  Dressings: Dry Dressings PRN.  He may shower over incisions.  Clean w/ soap and water.  No soaking.   VTE prophylaxis: Lovenox.  Follow up with Dr. Wandra Feinstein in 3-4 weeks.  Sooner if needed.  Please call with questions.  Subjective: Pain controlled.  Ambulating.  No new leg pain after fall last night.  Objective:   VITALS:   Vitals:   11/29/16 0456 11/29/16 1500 11/30/16 0345 11/30/16 0747  BP: (!) 153/79 (!) 131/55 140/70 (!) 138/58  Pulse: 85 85 (!) 108 86  Resp: Temp: 98.5 F (36.9 C) 99.1 F (37.3 C) 98.8 F (37.1 C)   TempSrc: Oral Oral Oral   SpO2: 95% 99% 100% 96%  Weight:      Height:       CBC Latest Ref Rng & Units 11/30/2016 11/28/2016 11/27/2016  WBC 4.0 - 10.5 K/uL 9.3 8.8 9.3  Hemoglobin 13.0 - 17.0 g/dL 1.6(X) 0.9(U) 0.4(V)  Hematocrit 39.0 - 52.0 % 27.8(L) 28.4(L) 28.9(L)  Platelets 150 - 400 K/uL 318 257 267   BMP Latest Ref Rng & Units 11/30/2016 11/28/2016 11/27/2016  Glucose 65 - 99 mg/dL 409(W) 119(J) -  BUN 6 - 20 mg/dL 13 13 -  Creatinine 4.78 - 1.24 mg/dL 2.95 6.21 3.08  BUN/Creat Ratio 10 - 24 - - -  Sodium 135 - 145 mmol/L 132(L) 133(L) -  Potassium 3.5 - 5.1 mmol/L 3.8 3.5 -  Chloride 101 - 111 mmol/L 99(L) 101 -  CO2 22 - 32 mmol/L 23 24 -  Calcium 8.9 - 10.3 mg/dL 6.5(H) 8.4(O) -    Intake/Output      04/19 0701 - 04/20 0700 04/20 0701 - 04/21 0700   P.O. 840    Total Intake(mL/kg) 840 (10.3)    Net +840          Urine Occurrence 250 x      Physical Exam: General: NAD.  Alert.  Interactive.  Answers questions appropriately. MSK Thigh Soft. Sensation intact distally Feet warm Dorsiflexion/Plantar flexion, EHL,FHL, intact Incision: C/D/I   Albina Billet III, PA-C 11/30/2016, 8:18 AM

## 2016-11-30 NOTE — Progress Notes (Signed)
Physical Therapy Session Note  Patient Details  Name: Jerry Zimmerman MRN: [id number] Date of Birth: 03-17-1941  Today's Date: 11/30/2016 PT Individual Time: 1005-1034 PT Individual Time Calculation (min): 29 min   Short Term Goals: Week 1:  PT Short Term Goal 1 (Week 1): Pt will perform all bed mobility tasks with supervision.  PT Short Term Goal 2 (Week 1): Pt will negotiate 12 steps with B rails and supervision assist. PT Short Term Goal 3 (Week 1): Pt will complete bed<>w/c transfers with supervision assist.  Skilled Therapeutic Interventions/Progress Updates:    Pt in bed upon arrival, willing to participate in bed exercises but refusing to attempt any mobility at this time due to fatigue and pain in Rt shoulder. Per EMR, pt did have a fall last night when attempting to get out of bed without assistance. CT of head and imaging of Rt femur conducted without acute changes noted. Bed level exercises completed including bilateral; QS, heel slides, SAQ, hip abd/add, ankle pumps (each 1X10). Pt declining any bed mobility due to fatigue and pain. Pt appearing more confused than usual but oriented to place and situation. Daughter present and agreeing with increased confusion today. Pt left in bed with daughter present, all needs in reach, and bed alarm on.   Therapy Documentation Precautions:  Precautions Precautions: Fall Precaution Comments: R SAH Restrictions Weight Bearing Restrictions: Yes RLE Weight Bearing: Weight bearing as tolerated Other Position/Activity Restrictions: 2/2 IM nail 11/17/16 General: PT Amount of Missed Time (min): 31 Minutes PT Missed Treatment Reason: Patient unwilling to participate;Patient fatigue;Pain Pain:  Rt shoulder 7/10 with motion. Nursing aware.    See Function Navigator for Current Functional Status.   Therapy/Group: Individual Therapy  Delton See, PT 11/30/2016, 10:39 AM

## 2016-11-30 NOTE — Progress Notes (Signed)
Occupational Therapy Note  Patient Details  Name: Jerry Zimmerman MRN: [id number] Date of Birth: 1940-11-27  Today's Date: 11/30/2016 OT Missed Time: 60 Minutes Missed Time Reason: Patient fatigue;Pain  Pt asleep upon arrival with daughter present.  Pt's daughter stated that pt was experiencing increased pain in R shoulder and that her father stated that he "just didn't feel right." Daughter stated that she preferred if her father could "rest."    Rich Brave 11/30/2016, 11:22 AM

## 2016-11-30 NOTE — Progress Notes (Signed)
No beds available near nurses station.  Ordered tele sitter at bedside to monitor patient.  Pt also seems more confused, not calling for assistance when prev had used call bell for assistance, new order for U/A and culture pending.
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
## 2016-12-01 ENCOUNTER — Inpatient Hospital Stay (HOSPITAL_COMMUNITY): Payer: 59 | Admitting: Speech Pathology

## 2016-12-01 ENCOUNTER — Inpatient Hospital Stay (HOSPITAL_COMMUNITY): Payer: 59

## 2016-12-01 ENCOUNTER — Inpatient Hospital Stay (HOSPITAL_COMMUNITY): Payer: 59 | Admitting: Physical Therapy

## 2016-12-01 LAB — GLUCOSE, CAPILLARY
GLUCOSE-CAPILLARY: 158 mg/dL — AB (ref 65–99)
GLUCOSE-CAPILLARY: 162 mg/dL — AB (ref 65–99)
Glucose-Capillary: 126 mg/dL — ABNORMAL HIGH (ref 65–99)
Glucose-Capillary: 159 mg/dL — ABNORMAL HIGH (ref 65–99)

## 2016-12-01 MED ORDER — LISINOPRIL 10 MG PO TABS
10.0000 mg | ORAL_TABLET | Freq: Every day | ORAL | Status: DC
  Administered 2016-12-01 – 2016-12-12 (×11): 10 mg via ORAL
  Filled 2016-12-01 (×12): qty 1

## 2016-12-01 NOTE — Progress Notes (Signed)
Occupational Therapy Session Note  Patient Details  Name: Jerry Zimmerman MRN: [id number] Date of Birth: [birth year]  Today's Date: 12/01/2016 OT Individual Time: 1100-1200 OT Individual Time Calculation (min): 60 min   Short Term Goals: Week 1:  OT Short Term Goal 1 (Week 1): Pt will engaged in 10 minutes of functional tasks with 2 rest breaks or less of 1 minute each.  OT Short Term Goal 2 (Week 1): Pt will perform bathing at shower level with min A in order to increase indepence with self care. OT Short Term Goal 3 (Week 1): Pt will perform shower transfer with min A.  OT Short Term Goal 4 (Week 1): Pt will perform toilet transfer with min A.   Skilled Therapeutic Interventions/Progress Updates:  ADL-retraining at shower level with focus on improved activity tolerance, functional transfers, and AE training (use of sock aid).   Pt required extra time to motivate to bathe/dress and overall min assist to perform bed mobility and BADL.   Daughter present during session.   Pt able to maintain standing balance as needed with min assist during assist with washing his buttocks and while standing to don underwear and pants.   OT demo'd use of sock aid to improved performance with lower body dressing at pt's request.   Pt only limited by mild right hip pain during session but reported improved vitality after showering as advised.     Therapy Documentation Precautions:  Precautions Precautions: Fall Precaution Comments: R SAH Restrictions Weight Bearing Restrictions: Yes RLE Weight Bearing: Weight bearing as tolerated Other Position/Activity Restrictions: 2/2 IM nail 11/17/16    Pain: Pain Assessment Pain Assessment: 0-10 Faces Pain Scale: Hurts a little bit Pain Type: Acute pain;Surgical pain Pain Location: Leg Pain Orientation: Right Pain Descriptors / Indicators: Aching Pain Onset: On-going Pain Intervention(s): Shower;Distraction;Environmental changes    See Function Navigator for  Current Functional Status.   Therapy/Group: Individual Therapy   Second session: Time: 1345-1415 Time Calculation (min): 30 min  Pain Assessment: NO/denies pain  Skilled Therapeutic Interventions: Therapeutic activity with focus on sit<>stand, standing balance, improved activity tolerance.   Pt received supine in bed with daughter present.   Pt required extra time but was able to roll to his left and rise to EOB with only min assist, using bed rail with min instructional cues.   Pt then completed squat pivot transfer to w/c with steadying assist and was escorted to washer/dryer room.   After setup to place clothes on dryer, pt rose from w/c to stand supported at washer while loading clothes with only supervision assist.   Pt recovered to w/c and was escorted to rehab gym for orientation and training on use of NuStep.   Pt completed 5 min trial, level 5 resistance, 272 steps without incident.   Vitals assessed, bp 135/57, HR 85, 02 sats 100% with no ill effects observed.  Pt returned to w/c and was escorted to his room and back to bed and end of session with min assist for transfer.   See FIM for current functional status  Therapy/Group: Individual Therapy  Alawna Graybeal 12/01/2016, 12:21 PM

## 2016-12-01 NOTE — Progress Notes (Signed)
Speech Language Pathology Daily Session Note  Patient Details  Name: Delano Frate MRN: [id number] Date of Birth: 09-05-1940  Today's Date: 12/01/2016 SLP Individual Time: 7829-5621 SLP Individual Time Calculation (min): 40 min  Short Term Goals: Week 1: SLP Short Term Goal 1 (Week 1): Patient will demonstrate functional problem solving for basic and familair tasks with Min A verbal cues.  SLP Short Term Goal 2 (Week 1): Patient will recall new, daily information with Min A verbal cues.  SLP Short Term Goal 3 (Week 1): Patient will demonstrate emergent awareness into difficulty of task and self-monitor and correct errors with Min A verbal cues.  SLP Short Term Goal 4 (Week 1): Patient will self-monitor and correct verbosity with Mod A verbal cues.   Skilled Therapeutic Interventions: Skilled treatment session focused on cognitive goals. SLP facilitated session by providing Max A verbal cues for recall of his current medications and their functions. Patient was able to independently recall 40% of his medications but required Max-Total A multimodal cues for mental flexibility in regards to previous medications and current medications since hospitalization. Patient handed off to OT. Continue with current plan of care.      Function:  Cognition Comprehension Comprehension assist level: Understands complex 90% of the time/cues 10% of the time  Expression   Expression assist level: Expresses complex 90% of the time/cues < 10% of the time  Social Interaction Social Interaction assist level: Interacts appropriately with others with medication or extra time (anti-anxiety, antidepressant).  Problem Solving Problem solving assist level: Solves basic 75 - 89% of the time/requires cueing 10 - 24% of the time  Memory Memory assist level: Recognizes or recalls 75 - 89% of the time/requires cueing 10 - 24% of the time    Pain No/Denies Pain   Therapy/Group: Individual Therapy  Sharissa Brierley,  Cienna Dumais 12/01/2016, 3:17 PM

## 2016-12-01 NOTE — Progress Notes (Signed)
Physical Therapy Session Note  Patient Details  Name: Jerry Zimmerman MRN: [id number] Date of Birth: [birth year]  Today's Date: 12/01/2016 PT Individual Time: 0915-1000 PT Individual Time Calculation (min): 45 min   Short Term Goals: Week 1:  PT Short Term Goal 1 (Week 1): Pt will perform all bed mobility tasks with supervision.  PT Short Term Goal 2 (Week 1): Pt will negotiate 12 steps with B rails and supervision assist. PT Short Term Goal 3 (Week 1): Pt will complete bed<>w/c transfers with supervision assist.  Skilled Therapeutic Interventions/Progress Updates: Pt received supine in bed, eating breakfast and daughter requests therapy hold until pt able to finish eating. When therapist returned pt c/o pain 5/10 in RLE and RN present at beginning of session to administer pain medication. Pt dons shirt in bed with S and increased time, cues for anterior weight shift off bed to assist with pulling shirt over trunk. Required max cues and encouragement to sit EOB to don underwear and pants. Ultimately performed semi-reclined>sit with HOB elevated and bedrails with S. Pt dons underwear, pants, shoes with setupA and increased time d/t pain and slow movement. Sit <>stand with close S to pull pants up. Gait x15' with RW and min guard; min cues to maintain RW closer to body. Pt impulsively sits in w/c with poor eccentric control requiring modA to control. Pt reports urgency to use restroom. Stand pivot transfer to/from toilet with grab bars and min guard. Pt manages clothing and hygiene with close S. Remained seated in w/c at end of session, chair alarm intact and all needs in reach. Alerted daughter to pt finished with session per request.      Therapy Documentation Precautions:  Precautions Precautions: Fall Precaution Comments: R SAH Restrictions Weight Bearing Restrictions: Yes RLE Weight Bearing: Weight bearing as tolerated Other Position/Activity Restrictions: 2/2 IM nail 11/17/16 General: PT  Amount of Missed Time (min): 15 Minutes PT Missed Treatment Reason: Other (Comment) (finishing breakfast)   See Function Navigator for Current Functional Status.   Therapy/Group: Individual Therapy  Vista Lawman 12/01/2016, 10:09 AM
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
## 2016-12-02 LAB — GLUCOSE, CAPILLARY
GLUCOSE-CAPILLARY: 112 mg/dL — AB (ref 65–99)
GLUCOSE-CAPILLARY: 149 mg/dL — AB (ref 65–99)
GLUCOSE-CAPILLARY: 174 mg/dL — AB (ref 65–99)

## 2016-12-02 MED ORDER — TRAMADOL HCL 50 MG PO TABS
50.0000 mg | ORAL_TABLET | Freq: Four times a day (QID) | ORAL | Status: DC | PRN
  Administered 2016-12-02: 50 mg via ORAL
  Filled 2016-12-02: qty 1
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
## 2016-12-03 ENCOUNTER — Inpatient Hospital Stay (HOSPITAL_COMMUNITY): Payer: 59

## 2016-12-03 ENCOUNTER — Encounter (HOSPITAL_COMMUNITY): Payer: 59 | Admitting: Psychology

## 2016-12-03 ENCOUNTER — Inpatient Hospital Stay (HOSPITAL_COMMUNITY): Payer: 59 | Admitting: Speech Pathology

## 2016-12-03 ENCOUNTER — Inpatient Hospital Stay (HOSPITAL_COMMUNITY): Payer: 59 | Admitting: Occupational Therapy

## 2016-12-03 DIAGNOSIS — F09 Unspecified mental disorder due to known physiological condition: Secondary | ICD-10-CM

## 2016-12-03 LAB — URINE CULTURE
Culture: >=100000 — AB
Special Requests: NORMAL

## 2016-12-03 LAB — GLUCOSE, CAPILLARY
GLUCOSE-CAPILLARY: 146 mg/dL — AB (ref 65–99)
Glucose-Capillary: 117 mg/dL — ABNORMAL HIGH (ref 65–99)
Glucose-Capillary: 178 mg/dL — ABNORMAL HIGH (ref 65–99)
Glucose-Capillary: 160 mg/dL — ABNORMAL HIGH (ref 65–99)
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
## 2016-12-03 NOTE — Progress Notes (Signed)
Speech Language Pathology Daily Session Note  Patient Details  Name: Jerry Zimmerman MRN: [id number] Date of Birth: 1940-12-14  Today's Date: 12/03/2016 SLP Individual Time: 1330-1400 SLP Individual Time Calculation (min): 30 min  Short Term Goals: Week 1: SLP Short Term Goal 1 (Week 1): Patient will demonstrate functional problem solving for basic and familair tasks with Min A verbal cues.  SLP Short Term Goal 2 (Week 1): Patient will recall new, daily information with Min A verbal cues.  SLP Short Term Goal 3 (Week 1): Patient will demonstrate emergent awareness into difficulty of task and self-monitor and correct errors with Min A verbal cues.  SLP Short Term Goal 4 (Week 1): Patient will self-monitor and correct verbosity with Mod A verbal cues.   Skilled Therapeutic Interventions: Skilled treatment session focused on cognitive goals. SLP facilitated session by initially providing Max A multimodal cues for comprehension of procedures to a novel task. Patient performed 1-back procedures with focus on working memory. Patient's best attempt at task was 10 repetitions but his average was ~6 repetitions before needing Mod A verbal cues to self-monitor and correct errors. Patient left upright in bed with all needs within reach and family present. Continue with current plan of care.      Function:    Cognition Comprehension Comprehension assist level: Understands basic 90% of the time/cues < 10% of the time  Expression   Expression assist level: Expresses basic 90% of the time/requires cueing < 10% of the time.  Social Interaction Social Interaction assist level: Interacts appropriately 50 - 74% of the time - May be physically or verbally inappropriate.  Problem Solving Problem solving assist level: Solves basic 50 - 74% of the time/requires cueing 25 - 49% of the time  Memory Memory assist level: Recognizes or recalls 75 - 89% of the time/requires cueing 10 - 24% of the time     Pain No/Denies Pain   Therapy/Group: Individual Therapy  Annet Manukyan 12/03/2016, 3:01 PM

## 2016-12-03 NOTE — Progress Notes (Signed)
Occupational Therapy Session Note  Patient Details  Name: Jerry Zimmerman MRN: [id number] Date of Birth: 1941/07/20  Today's Date: 12/03/2016 OT Individual Time: 1100-1215 OT Individual Time Calculation (min): 75 min    Short Term Goals: Week 1:  OT Short Term Goal 1 (Week 1): Pt will engaged in 10 minutes of functional tasks with 2 rest breaks or less of 1 minute each.  OT Short Term Goal 2 (Week 1): Pt will perform bathing at shower level with min A in order to increase indepence with self care. OT Short Term Goal 3 (Week 1): Pt will perform shower transfer with min A.  OT Short Term Goal 4 (Week 1): Pt will perform toilet transfer with min A.   Skilled Therapeutic Interventions/Progress Updates: Pt lying in bed at time of arrival with daughter present, agreeable to complete ADLs with encouragement. Orthostatics taken due to lightheadedness and low BP during previous therapy. Please see flowsheet for details. Pt with no c/o dizziness and wanted to proceed with tx. He ambulated with RW and Min A to toilet. Toileting tasks completed with min guard. He then ambulated short distance to shower. He bathed while seated on tub bench, required Min A for standing balance, heavy reliance on grab bars. Dressing then completed at edge of tub bench, standing as needed with RW. Mod A required due to decreased R LE pain. Teds donned with Total A. He donned sandals with setup and ambulated back to bed with RW (per request, due to fatigue: "I will hold on lunch."). He was left with RN at time of departure.   Per RN, incision site did not need to be covered during shower.       Therapy Documentation Precautions:  Precautions Precautions: Fall Precaution Comments: R SAH Restrictions Weight Bearing Restrictions: Yes RLE Weight Bearing: Weight bearing as tolerated Other Position/Activity Restrictions: 2/2 IM nail 11/17/16 General:   Vital Signs: Therapy Vitals Pulse Rate: 88 BP: 96/72 Patient Position (if  appropriate): Orthostatic Vitals Pain: Pt medicated at start of session   ADL:      :    See Function Navigator for Current Functional Status.   Therapy/Group: Individual Therapy  Lynsie Mcwatters A Juanjesus Pepperman 12/03/2016, 12:52 PM

## 2016-12-03 NOTE — Progress Notes (Addendum)
Physical Therapy Note  Patient Details  Name: Jerry Zimmerman MRN: [id number] Date of Birth: 05-Apr-1941 Today's Date: 12/03/2016  0905-1000, 55 min individual tx Pain: none per pt  dtr here serving as interpreter  Bed mobility in flat bed without rails, supervision, cues.  Transfer to w/c using RW; assistance needed for eccentric control.  Seated therapeutic exercise performed with LEs to increase strength for functional mobility: R long arc quad knee ext, R/L marching, 15 x 1 each.  Eccentric control R hip extensors with R foot on compressable target.. Gait with RW x 40' on level tile; pt requested seated rest break before attempting steps. Up/cown (4) 6" high steps, bil rails, min guard assist, min cues for sequencing.  After steps, pt stated he felt woozy. After seated rest break, BP 89/56, HR 88. Ene, RN informed.  Pt left resting in bed with  all needs within reach.  See function navigator for current status.    Pearle Wandler 12/03/2016, 7:50 AM

## 2016-12-03 NOTE — Progress Notes (Signed)
Physical Therapy Session Note  Patient Details  Name: Jerry Zimmerman MRN: [id number] Date of Birth: Jul 21, 1941  Today's Date: 12/03/2016 PT Individual Time: 1445-1530 PT Individual Time Calculation (min): 45 min   Short Term Goals: Week 1:  PT Short Term Goal 1 (Week 1): Pt will perform all bed mobility tasks with supervision.  PT Short Term Goal 2 (Week 1): Pt will negotiate 12 steps with B rails and supervision assist. PT Short Term Goal 3 (Week 1): Pt will complete bed<>w/c transfers with supervision assist.  Skilled Therapeutic Interventions/Progress Updates:   Session focused on functional endurance, transfers, gait training, and self-care tasks. Pt requires extra time and assist with donning socks to prepare for therapy session. PT and daughter encouraged pt to try to do things for himself as this is the point of being here on CIR. Pt completed toileting tasks and hygiene at sink with overall min assist for transfers and balance. Gait training in hallway with focus on gait pattern and maintaining close position of RW to body instead of so far forward causing pt to be in flexed posture x 25' and then x 56' with supervision to min assist. End of session set-up in recliner with all needs in reach and chair alarm on. Daughter present in room as well as Cabin crew.   Therapy Documentation Precautions:  Precautions Precautions: Fall Precaution Comments: R SAH Restrictions Weight Bearing Restrictions: Yes RLE Weight Bearing: Weight bearing as tolerated Other Position/Activity Restrictions: 2/2 IM nail 11/17/16 Pain:  No complaints of pain   See Function Navigator for Current Functional Status.   Therapy/Group: Individual Therapy  Karolee Stamps Darrol Poke, PT, DPT  12/03/2016, 4:03 PM

## 2016-12-03 NOTE — Care Management Note (Signed)
[facility] Individual Statement of Services  Patient Name:  Jerry Zimmerman  Date:  11/30/2016  Welcome to the [facility].  Our goal is to provide you with an individualized program based on your diagnosis and situation, designed to meet your specific needs.  With this comprehensive rehabilitation program, you will be expected to participate in at least 3 hours of rehabilitation therapies Monday-Friday, with modified therapy programming on the weekends.  Your rehabilitation program will include the following services:  Physical Therapy (PT), Occupational Therapy (OT), Speech Therapy (ST), 24 hour per day rehabilitation nursing, Therapeutic Recreaction (TR), Neuropsychology, Case Management (Social Worker), Rehabilitation Medicine, Nutrition Services and Pharmacy Services  Weekly team conferences will be held on Tuesdays to discuss your progress.  Your Social Worker will talk with you frequently to get your input and to update you on team discussions.  Team conferences with you and your family in attendance may also be held.  Expected length of stay: 14 days  Overall anticipated outcome: supervision  Depending on your progress and recovery, your program may change. Your Social Worker will coordinate services and will keep you informed of any changes. Your Social Worker's name and contact numbers are listed  below.  The following services may also be recommended but are not provided by the [facility]:   Driving Evaluations  Home Health Rehabiltiation Services  Outpatient Rehabilitation Services  Vocational Rehabilitation   Arrangements will be made to provide these services after discharge if needed.  Arrangements include referral to agencies that provide these services.  Your insurance has been verified to be:  [facility] Your primary doctor is:  Theora Gianotti  Pertinent information will be shared with your doctor and your insurance  company.  Social Worker:  [place], [place] [phone] or [phone]   Information discussed with and copy given to patient by: Amada Jupiter, 11/30/2016, 3:04 PM
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
## 2016-12-04 ENCOUNTER — Inpatient Hospital Stay (HOSPITAL_COMMUNITY): Payer: 59 | Admitting: Speech Pathology

## 2016-12-04 ENCOUNTER — Inpatient Hospital Stay (HOSPITAL_COMMUNITY): Payer: 59 | Admitting: Physical Therapy

## 2016-12-04 ENCOUNTER — Inpatient Hospital Stay (HOSPITAL_COMMUNITY): Payer: 59 | Admitting: Occupational Therapy

## 2016-12-04 LAB — GLUCOSE, CAPILLARY
GLUCOSE-CAPILLARY: 120 mg/dL — AB (ref 65–99)
GLUCOSE-CAPILLARY: 166 mg/dL — AB (ref 65–99)
GLUCOSE-CAPILLARY: 190 mg/dL — AB (ref 65–99)
Glucose-Capillary: 144 mg/dL — ABNORMAL HIGH (ref 65–99)

## 2016-12-04 NOTE — Plan of Care (Signed)
Problem: RH Ambulation Goal: LTG Patient will ambulate in controlled environment (PT) LTG: Patient will ambulate in a controlled environment, # of feet with assistance (PT).  50 ft with LRAD Goal: LTG Patient will ambulate in home environment (PT) LTG: Patient will ambulate in home environment, # of feet with assistance (PT).  25 ft with LRAD

## 2016-12-04 NOTE — Progress Notes (Signed)
Occupational Therapy Session Note  Patient Details  Name: Jerry Zimmerman MRN: [id number] Date of Birth: 1941/03/25  Today's Date: 12/04/2016 OT Individual Time: 0850-1000 OT Individual Time Calculation (min): 70 min    Short Term Goals: Week 1:  OT Short Term Goal 1 (Week 1): Pt will engaged in 10 minutes of functional tasks with 2 rest breaks or less of 1 minute each.  OT Short Term Goal 2 (Week 1): Pt will perform bathing at shower level with min A in order to increase indepence with self care. OT Short Term Goal 3 (Week 1): Pt will perform shower transfer with min A.  OT Short Term Goal 4 (Week 1): Pt will perform toilet transfer with min A.   Skilled Therapeutic Interventions/Progress Updates:  Daughter present for session and discussed d/c planning and DME recommendations, Self care retraining at shower level with focus on sit to stands with proper hand placement, functional ambulation with RW, activity tolerance, standing balance, etc. Pt able to ambulate with close supervision to occasional steadying A. Pt able to bathe with extra time with setup and use of grab bar for sit to stands. Pt has a free standing tub with hand held shower head- discussed will need close supervision for sit to stands. Practiced tub bench transfer into a tub with extra time and instructional cues. Pt can thread LB clothing with more than reasonable amt of time but does request A for donning socks- declines AE. Left in w/c in prep for next therapy session,      Therapy Documentation Precautions:  Precautions Precautions: Fall Precaution Comments: R SAH Restrictions Weight Bearing Restrictions: Yes RLE Weight Bearing: Weight bearing as tolerated Other Position/Activity Restrictions: 2/2 IM nail 11/17/16 Pain:no c/o pain   Other Treatments:    See Function Navigator for Current Functional Status.   Therapy/Group: Individual Therapy  Roney Mans [facility] 12/04/2016, 2:10 PM

## 2016-12-04 NOTE — Progress Notes (Signed)
Physical Therapy Session Note  Patient Details  Name: Jerry Zimmerman MRN: [id number] Date of Birth: Feb 12, 1941  Today's Date: 12/04/2016 PT Individual Time: 1430-1500 PT Individual Time Calculation (min): 30 min   Short Term Goals: Week 1:  PT Short Term Goal 1 (Week 1): Pt will perform all bed mobility tasks with supervision.  PT Short Term Goal 2 (Week 1): Pt will negotiate 12 steps with B rails and supervision assist. PT Short Term Goal 3 (Week 1): Pt will complete bed<>w/c transfers with supervision assist.  Skilled Therapeutic Interventions/Progress Updates: Pt received seated in w/c, denies pain and agreeable to treatment. W/c propulsion x150' with BUE for strengthening and endurance. Standing balance on biodex while playing catch game on stable platform for focus on weight shifting, ankle strategy; began with BUE support, progressed to no UE support for increased challenge. Stand pivot transfer to returned to bed with RW and close S. MinA sit >supine for RLE management. As pt began getting back into bed he expressed concerns about his sugar being elevated over what is normal for him. Therapist told pt she would pass on to nursing, but pt assures he has already spoken with nursing about his concerns, he just wanted to reiterate that he would "like some answers" before he discharges home. Remained in bed with alarm intact and all needs in reach at completion of session.      Therapy Documentation Precautions:  Precautions Precautions: Fall Precaution Comments: R SAH Restrictions Weight Bearing Restrictions: Yes RLE Weight Bearing: Weight bearing as tolerated Other Position/Activity Restrictions: 2/2 IM nail 11/17/16 Pain: Pain Assessment Pain Assessment: No/denies pain  See Function Navigator for Current Functional Status.   Therapy/Group: Individual Therapy  Vista Lawman 12/04/2016, 3:55 PM
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
## 2016-12-04 NOTE — Progress Notes (Signed)
Speech Language Pathology Daily Session Notes  Patient Details  Name: Jerry Zimmerman MRN: [id number] Date of Birth: [birth year]  Today's Date: 12/04/2016  Session 1:SLP Individual Time: 0730-0800 SLP Individual Time Calculation (min): 30 min    Session 2:SLP Individual Time: 1130-1200 SLP Individual Time Calculation (min): 30 min Short Term Goals: Week 1: SLP Short Term Goal 1 (Week 1): Patient will demonstrate functional problem solving for basic and familair tasks with Min A verbal cues.  SLP Short Term Goal 2 (Week 1): Patient will recall new, daily information with Min A verbal cues.  SLP Short Term Goal 3 (Week 1): Patient will demonstrate emergent awareness into difficulty of task and self-monitor and correct errors with Min A verbal cues.  SLP Short Term Goal 4 (Week 1): Patient will self-monitor and correct verbosity with Mod A verbal cues.   Skilled Therapeutic Interventions:  Session 1: Skilled treatment session focused on cognitive goals. Patient was Mod I for problem solving during basic self-care tasks and demonstrated alternating attention between self-feeding and functional conversation with Mod I. Patient left upright in bed with all needs within reach. Continue with current plan of care.   Session 2: Skilled treatment session focused on cognitive goals. SLP facilitated session by providing Min A verbal cues to verbalize safety precautions and to verbally sequence steps for a safe transfer. Patient left upright in wheelchair with all needs within reach and daughter present. Continue with current plan of care.      Function:  Cognition Comprehension Comprehension assist level: Understands basic 90% of the time/cues < 10% of the time  Expression   Expression assist level: Expresses basic 90% of the time/requires cueing < 10% of the time.  Social Interaction Social Interaction assist level: Interacts appropriately 75 - 89% of the time - Needs redirection for appropriate  language or to initiate interaction.  Problem Solving Problem solving assist level: Solves basic 50 - 74% of the time/requires cueing 25 - 49% of the time  Memory Memory assist level: Recognizes or recalls 75 - 89% of the time/requires cueing 10 - 24% of the time    Pain Pain Assessment Pain Assessment: No/denies pain  Therapy/Group: Individual Therapy  Marisel Tostenson 12/04/2016, 2:55 PM

## 2016-12-04 NOTE — Progress Notes (Signed)
Physical Therapy Session Note  Patient Details  Name: Jerry Zimmerman MRN: [id number] Date of Birth: 07-12-1941  Today's Date: 12/04/2016 PT Individual Time: 4098-1191 and 4782-9562 PT Individual Time Calculation (min): 57 min and 44 min  Short Term Goals: Week 1:  PT Short Term Goal 1 (Week 1): Pt will perform all bed mobility tasks with supervision.  PT Short Term Goal 2 (Week 1): Pt will negotiate 12 steps with B rails and supervision assist. PT Short Term Goal 3 (Week 1): Pt will complete bed<>w/c transfers with supervision assist.  Skilled Therapeutic Interventions/Progress Updates:  Treatment 1: Pt received in w/c & agreeable to tx. Pt denied c/o pain. Transported pt room<>gym via w/c total assist for time management. Gait training x 35 ft + 35 ft with RW & steady assist; pt requires rest breaks 2/2 general fatigue (HR = 101 bpm, SpO2 = 100% after task). Pt requires occasional subtle cuing for hand placement during sit<>stand transfers and cuing for gait pattern, to increase gait speed with fair/poor return demo. Pt continues to ambulate at a significantly reduced gait speed and with step to pattern. Pt also requires cuing for positioning of RW as pt tends to push it too far out in front of him. Pt performed BLE strengthening exercises including standing hip/knee flexion and heel raises with RW for UE support & balance. Pt negotiated 8 steps (3") with B rails and cuing for compensatory technique; pt required min assist overall. At end of session pt left sitting in w/c in room with QRB donned, all needs within reach, and daughter present to supervise.  During session pt requires cuing to focus on topic at hand as he is easily distracted & will engage in tangential conversation.  Treatment 2: Pt received in bed & agreeable to tx. Pt reported pain in R LE but did not want therapist to request pain medication from RN. Pt transferred supine>sitting EOB with supervision, HOB raised, and bed rails. Pt  transferred bed>w/c via stand pivot with RW & steady assist. Transported pt room<>gym via w/c total assist for time management. Pt completed Berg Balance Test & scored 18/56; educated pt on interpretation of score & current fall risk (also made daughter aware). Patient demonstrates increased fall risk as noted by score of 18/56 on Berg Balance Scale.  (<36= high risk for falls, close to 100%; 37-45 significant >80%; 46-51 moderate >50%; 52-55 lower >25%). Pt unable to attempt or requires physical assistance from therapist to attempt several activities on [employer]; pt also had difficulty following commands for several tasks. Pt verbalizing that he is concerned about elevated blood sugars; therapist provided listening & education. At end of session pt left sitting in w/c in room with QRB donned, all needs within reach, and daughter present to supervise.    Therapy Documentation Precautions:  Precautions Precautions: Fall Precaution Comments: R SAH Restrictions Weight Bearing Restrictions: Yes RLE Weight Bearing: Weight bearing as tolerated Other Position/Activity Restrictions: 2/2 IM nail 11/17/16   Balance: Balance Balance Assessed: Yes Standardized Balance Assessment Standardized Balance Assessment: Berg Balance Test Berg Balance Test Sit to Stand: Able to stand without using hands and stabilize independently Standing Unsupported: Able to stand 2 minutes with supervision Sitting with Back Unsupported but Feet Supported on Floor or Stool: Able to sit safely and securely 2 minutes Stand to Sit: Uses backs of legs against chair to control descent Transfers: Able to transfer with verbal cueing and /or supervision Standing Unsupported with Eyes Closed: Able to stand 10  seconds with supervision Standing Ubsupported with Feet Together: Needs help to attain position and unable to hold for 15 seconds From Standing, Reach Forward with Outstretched Arm: Loses balance while trying/requires  external support From Standing Position, Pick up Object from Floor: Unable to try/needs assist to keep balance From Standing Position, Turn to Look Behind Over each Shoulder: Needs assist to keep from losing balance and falling Turn 360 Degrees: Needs assistance while turning Standing Unsupported, Alternately Place Feet on Step/Stool: Needs assistance to keep from falling or unable to try Standing Unsupported, One Foot in Front: Loses balance while stepping or standing Standing on One Leg: Unable to try or needs assist to prevent fall Total Score: 18   See Function Navigator for Current Functional Status.   Therapy/Group: Individual Therapy  Sandi Mariscal 12/04/2016, 2:34 PM

## 2016-12-05 ENCOUNTER — Inpatient Hospital Stay (HOSPITAL_COMMUNITY): Payer: 59 | Admitting: Physical Therapy

## 2016-12-05 ENCOUNTER — Inpatient Hospital Stay (HOSPITAL_COMMUNITY): Payer: 59 | Admitting: Speech Pathology

## 2016-12-05 ENCOUNTER — Inpatient Hospital Stay (HOSPITAL_COMMUNITY): Payer: 59 | Admitting: Occupational Therapy

## 2016-12-05 LAB — GLUCOSE, CAPILLARY
GLUCOSE-CAPILLARY: 117 mg/dL — AB (ref 65–99)
GLUCOSE-CAPILLARY: 175 mg/dL — AB (ref 65–99)
Glucose-Capillary: 125 mg/dL — ABNORMAL HIGH (ref 65–99)
Glucose-Capillary: 129 mg/dL — ABNORMAL HIGH (ref 65–99)

## 2016-12-05 MED ORDER — METFORMIN HCL 500 MG PO TABS
250.0000 mg | ORAL_TABLET | Freq: Every day | ORAL | Status: DC
  Administered 2016-12-05 – 2016-12-11 (×7): 250 mg via ORAL
  Filled 2016-12-05 (×7): qty 1

## 2016-12-05 NOTE — Progress Notes (Signed)
Social Work Patient ID: Jerry Zimmerman, male   DOB: [birth year], 76 y.o.   MRN: [id number]   Met with pt and daughter this morning to review team conference and further discussion had with therapies today.  Team reporting yesterday that they felt pt would need 24/7 supervision at home, however, family does not have the ability to provide this.  Discussion this morning that pt was doing better overall and felt he could reach mod ind level if LOS extended a few more days than targeted in conf.  Pt and daughter have discussed this information and agree that they feel he is making progress and agree to an extension.  Therapies are aware and will regroup tomorrow to set new targeted d/c date.  Have alerted MD as well - await his input.    Jewelz Ricklefs, LCSW

## 2016-12-05 NOTE — Patient Care Conference (Signed)
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 Cognitive disorder   . 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 Closed traumatic nondisplaced fracture of shaft of right femur with routine healing 11/27/2016  . Subarachnoid hemorrhage (HCC) 11/17/2016  . Displaced transverse fracture of shaft of right femur, initial encounter for closed fracture (HCC) 11/17/2016  . Rectal bleeding 02/29/2016  . Neuropathy 02/29/2016  . Diabetic retinopathy (HCC) 07/09/2014  . 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 Essential hypertension, benign   . Obesity   . SLEEP APNEA 05/17/2009  . DM (diabetes mellitus), type 2 with ophthalmic complications (HCC) 05/16/2009  . Hyperlipidemia 05/16/2009    Expected Discharge Date: Expected Discharge Date: 12/07/16  Team Members Present: Physician leading conference: Dr. Faith Rogue Social Worker Present: Amada Jupiter, LCSW Nurse Present: Carmie End, RN PT Present: Karolee Stamps, PT;Victoria Hyacinth Meeker, PT OT Present: Roney Mans, OT SLP Present: Feliberto Gottron, SLP PPS Coordinator present : Tora Duck, RN, CRRN     Current Status/Progress Goal Weekly Team Focus  Medical   TBI. right hip fracture. had fall last week. no  further issues. pain controlled  improve functional mobility/balance  safety, pain mgt, bladder function   Bowel/Bladder   continent of bowel and bladder; LBM 12/03/16  maintain bowel and bladder continence  monitor for changes in bowel and bladder continence and for need for bowel management   Swallow/Nutrition/ Hydration             ADL's   Min-Mod A BADLs at shower level, Min A functional transfers   Supervision overall   AE training with footwear, functional transfers, endurance, UB strengthening, family education   Mobility   supervision<>min assist overall  supervision overall  transfers, gait training, endurance, strengthening, cognitive remediation, balance   Communication             Safety/Cognition/ Behavioral Observations  Min A  Supervision  recall, problem solving, awareness   Pain   pt  c/o burning sensation to feet  <4  assess for pain Q shift and PRN   Skin   surgical incision--steri strips to R leg and R hip; abrasion to right brow bone  skin free of infection and/or breakdown  monitor for changes in skin integrity    Rehab Goals Patient on target to meet rehab goals: Yes *See Care Plan and progress notes for long and short-term goals.  Barriers to Discharge: safety awareness, balance    Possible Resolutions to Barriers:  ongoing NMR, cognitive remediation/ family ed, supervision at discharge    Discharge Planning/Teaching Needs:  Pt will d/c home with wife who works f/t.  Daughter reports family does not have plan of how they will provide 24/7 supervision  which is recommended.  Teaching is ongoing.   Team Discussion:  Showing cognitive improvements;  All scans after recent fall are clear.  Has ambulated up to 100' but very easily fatigues.  Very slow with ADLs;  Good recall of functional information.  Frequently uses excuse "I'm old". Much concern about his safety if left alone at home and feel he will need 24/7 supervision - SW to follow up.  Revisions to  Treatment Plan:  None   Continued Need for Acute Rehabilitation Level of Care: The patient requires daily medical management by a physician with specialized training in physical medicine and rehabilitation for the following conditions: Daily direction of a multidisciplinary physical rehabilitation program to ensure safe treatment while eliciting the highest outcome that is of practical value to the patient.: Yes Daily medical management of patient stability for increased activity during participation in an intensive rehabilitation regime.: Yes Daily analysis of laboratory values and/or radiology reports with any subsequent need for medication adjustment of medical intervention for : Post surgical problems;Neurological problems;Urological problems  Jerry Zimmerman 12/05/2016, 1:22 PM

## 2016-12-05 NOTE — Plan of Care (Signed)
Problem: RH SKIN INTEGRITY Goal: RH STG ABLE TO PERFORM INCISION/WOUND CARE W/ASSISTANCE STG Able To Perform Right Leg Incision Care With Mod Assistance.  Outcome: Progressing Incision healed  Problem: RH SAFETY Goal: RH STG ADHERE TO SAFETY PRECAUTIONS W/ASSISTANCE/DEVICE STG Adhere to Safety Precautions With Mod Assistance and appropriate assistive Device, pt and pt family will verbalize safety precautions prior to discharge.  Outcome: Progressing Fall preventions and safety precaution maintained   Problem: RH PAIN MANAGEMENT Goal: RH STG PAIN MANAGED AT OR BELOW PT'S PAIN GOAL <4 on a 0-10 pain scale  Outcome: Progressing Denies pain
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
## 2016-12-05 NOTE — Progress Notes (Signed)
Physical Therapy Weekly Progress Note  Patient Details  Name: Jerry Zimmerman MRN: [id number] Date of Birth: August 02, 1941  Beginning of progress report period: November 28, 2016 End of progress report period: December 05, 2016  Today's Date: 12/05/2016   Patient has met 3 of 3 short term goals.  Pt has made good progress towards LTGs over the past 2 days and is now supervision overall with functional mobility. Pt has potential to progress towards Mod I with functional mobility and would benefit from continued pt education, gait training, stair negotiation, and balance training prior to d/c.  Patient continues to demonstrate the following deficits muscle weakness, decreased cardiorespiratoy endurance, decreased problem solving and decreased safety awareness, and decreased standing balance and decreased balance strategies and therefore will continue to benefit from skilled PT intervention to increase functional independence with mobility.  LTG's have been upgraded to mod I overall. Please see goals for revisions. Continue POC.  PT Short Term Goals Week 1:  PT Short Term Goal 1 (Week 1): Pt will perform all bed mobility tasks with supervision.  PT Short Term Goal 1 - Progress (Week 1): Met PT Short Term Goal 2 (Week 1): Pt will negotiate 12 steps with B rails and supervision assist. PT Short Term Goal 2 - Progress (Week 1): Met PT Short Term Goal 3 (Week 1): Pt will complete bed<>w/c transfers with supervision assist. PT Short Term Goal 3 - Progress (Week 1): Met Week 2:  PT Short Term Goal 1 (Week 2): STG = LTG due to estimated d/c date.   Therapy Documentation Precautions:  Precautions Precautions: Fall Precaution Comments: R SAH Restrictions Weight Bearing Restrictions: Yes RLE Weight Bearing: Weight bearing as tolerated Other Position/Activity Restrictions: 2/2 IM nail 11/17/16  See Function Navigator for Current Functional Status.   Waunita Schooner 12/05/2016, 2:52 PM

## 2016-12-05 NOTE — Progress Notes (Signed)
Physical Therapy Session Note  Patient Details  Name: Jerry Zimmerman MRN: [id number] Date of Birth: 1941/04/16  Today's Date: 12/05/2016 PT Individual Time: 1115-1200 PT Individual Time Calculation (min): 45 min   Short Term Goals: Week 1:  PT Short Term Goal 1 (Week 1): Pt will perform all bed mobility tasks with supervision.  PT Short Term Goal 2 (Week 1): Pt will negotiate 12 steps with B rails and supervision assist. PT Short Term Goal 3 (Week 1): Pt will complete bed<>w/c transfers with supervision assist.  Skilled Therapeutic Interventions/Progress Updates:      Therapy Documentation Precautions:  Precautions Precautions: Fall Precaution Comments: R SAH Restrictions Weight Bearing Restrictions: Yes RLE Weight Bearing: Weight bearing as tolerated Other Position/Activity Restrictions: 2/2 IM nail 11/17/16  See Function Navigator for Current Functional Status.  Pt presented in w/c conferring with CM and dgt regarding possible change in d/c date and current level of assist. Pt indicated had performed gait and stairs this am and agreeable to work on balance. Use of Biodex playing catch game on static level for emphasis on weight shifting. Pt able to maintain standing >10 min with BUE support with intermittent single UE support. Pt able to wt shift to L/R with some increased diffculty wt shifting forward. Pt demonstrated appropriate hand placement for sit to/from stand from w/c and step up onto Biodex with supervision. Pt returned to room with dgt present and needs met.    Therapy/Group: Individual Therapy  Nancie Bocanegra  Kebra Lowrimore, PTA  12/05/2016, 2:24 PM

## 2016-12-05 NOTE — Progress Notes (Signed)
Physical Therapy Session Note  Patient Details  Name: Jerry Zimmerman MRN: [id number] Date of Birth: Jan 05, 1941  Today's Date: 12/05/2016 PT Individual Time: 1000-1101 and 1435-1531 PT Individual Time Calculation (min): 61 min and 56 min  Skilled Therapeutic Interventions/Progress Updates:  Treatment 1: Pt received sitting on EOB & agreeable to tx, denying c/o pain. Pt's daughter in room & therapist educated her on this therapist's recommendation of 24 hr supervision upon d/c; pt's daughter reports this isn't possible as pt's wife works during the day and the daughter will be traveling back home on Sunday. Discussed DME recommendations of HHPT, RW, and w/c (pt will need 18x16). Pt transferred bed>w/c via squat pivot with supervision assist. Transported pt to dayroom and pt able to lock w/c brakes and manage parts with supervision/min assist and heavy instructional cuing. Pt completed squat pivot w/c<>mat table with distant supervision. Pt ambulated ~130 ft with RW & supervision, continuing to demonstrate step-to gait pattern. Pt negotiated 16 stairs laterally with R rail and supervision assist with instructional cuing for compensatory pattern. At end of session pt left sitting in w/c in room with QRB donned, all needs within reach, and daughter present to supervise.  Treatment 2: Pt received in w/c & agreeable to tx; pt reported 3/10 pain in RLE & rest breaks provided PRN. Pt & daughter report pt is agreeable to longer LOS to attempt to reach mod I level goals. Transported pt to gym via w/c total assist. Pt utilized nu-step with all 4 extremities on level 3 x 12 minutes with task focusing on endurance training & strengthening. Pt ambulated gym<>rehab apartment with RW & progressing to step through gait pattern with supervision overall. Pt performed bed mobility in regular bed in apartment with supervision. Pt ambulated gym>room with RW & supervision with decreased gait speed. Pt reported need to use restroom  and completed stand>sit on toilet with supervision. Pt left on toilet with RN present in room.   Therapy Documentation Precautions:  Precautions Precautions: Fall Precaution Comments: R SAH Restrictions Weight Bearing Restrictions: Yes RLE Weight Bearing: Weight bearing as tolerated Other Position/Activity Restrictions: 2/2 IM nail 11/17/16    See Function Navigator for Current Functional Status.   Therapy/Group: Individual Therapy  Sandi Mariscal 12/05/2016, 3:42 PM

## 2016-12-05 NOTE — Progress Notes (Signed)
Speech Language Pathology Weekly Progress and Session Note  Patient Details  Name: Jerry Zimmerman MRN: [id number] Date of Birth: 1941-02-19  Beginning of progress report period: November 28, 2016 End of progress report period: December 05, 2016  Today's Date: 12/05/2016 SLP Individual Time: 1330-1430 SLP Individual Time Calculation (min): 60 min  Short Term Goals: Week 1: SLP Short Term Goal 1 (Week 1): Patient will demonstrate functional problem solving for basic and familair tasks with Min A verbal cues.  SLP Short Term Goal 1 - Progress (Week 1): Met SLP Short Term Goal 2 (Week 1): Patient will recall new, daily information with Min A verbal cues.  SLP Short Term Goal 2 - Progress (Week 1): Met SLP Short Term Goal 3 (Week 1): Patient will demonstrate emergent awareness into difficulty of task and self-monitor and correct errors with Min A verbal cues.  SLP Short Term Goal 3 - Progress (Week 1): Met SLP Short Term Goal 4 (Week 1): Patient will self-monitor and correct verbosity with Mod A verbal cues.  SLP Short Term Goal 4 - Progress (Week 1): Met    New Short Term Goals: Week 2: SLP Short Term Goal 1 (Week 2): Patient will demonstrate functional problem solving for basic and familair tasks with supervision verbal cues.  SLP Short Term Goal 2 (Week 2): Patient will recall new, daily information with supervision verbal cues.  SLP Short Term Goal 3 (Week 2): Patient will demonstrate emergent awareness into difficulty of task and self-monitor and correct errors with supervision verbal cues.  SLP Short Term Goal 4 (Week 2): Patient will self-monitor and correct verbosity with Min A verbal cues.   Weekly Progress Updates: Patient has made functional gains and has met 4 of 4 STG's this reporting period. Currently, patient requires overall Min A to complete functional and familiar tasks safely in regards to problem solving, awareness and recall. Patient and family education is ongoing and patient's  LOS has been adjusted in order to maximize overall functional independence. Patient would benefit from continued skilled SLP intervention to maximize his cognitive function and overall functional independence prior to discharge.      Intensity: Minumum of 1-2 x/day, 30 to 90 minutes Frequency: 3 to 5 out of 7 days Duration/Length of Stay: ~1 week  Treatment/Interventions: Cognitive remediation/compensation;Cueing hierarchy;Functional tasks;Environmental controls;Patient/family education;Therapeutic Activities   Daily Session  Skilled Therapeutic Interventions: Skilled treatment session focused on cognitive goals. Upon arrival, patient was awake in bed and transferred to the wheelchair with supervision. SLP facilitated session by providing an external aid to [facility] recall of his current medications and their functions. Patient organized a BID pill box with Min A verbal cues needed to self-monitor and correct errrors. Patient left upright in wheelchair with all needs within reach.       Function:    Cognition Comprehension Comprehension assist level: Follows basic conversation/direction with no assist  Expression   Expression assist level: Expresses basic needs/ideas: With no assist  Social Interaction Social Interaction assist level: Interacts appropriately 90% of the time - Needs monitoring or encouragement for participation or interaction.  Problem Solving Problem solving assist level: Solves basic 75 - 89% of the time/requires cueing 10 - 24% of the time  Memory Memory assist level: Recognizes or recalls 75 - 89% of the time/requires cueing 10 - 24% of the time   Pain No/Denies Pain   Therapy/Group: Individual Therapy  Aaryanna Hyden 12/05/2016, 3:31 PM

## 2016-12-05 NOTE — Progress Notes (Signed)
Occupational Therapy Session Note  Patient Details  Name: Jerry Zimmerman MRN: [id number] Date of Birth: July 27, 1941  Today's Date: 12/05/2016 OT Individual Time: 4098-1191 OT Individual Time Calculation (min): 45 min    Skilled Therapeutic Interventions/Progress Updates: 1:1 self care retraining to include bed mobility, bathing at shower level, dressing with setup and functional mobility around the room with RW.  Pt got out of bed without use of bed rails without assistance or cues. Pt able to doff socks in reclined position on bed with extra time. Pt still requires cues for initial sit to stand for proper hand placement (instead of pulling up on the RW).  Pt able to bathe, once setup, with distant supervision sit to stand with grab bar.    Pt continues to request help to don his socks and his daughter always performs this task for him now. Pt relays his wife always performs this PTA.  Will try elastic shoe laces tomorrow for increasing independence with fastening shoes.       Therapy Documentation Precautions:  Precautions Precautions: Fall Precaution Comments: R SAH Restrictions Weight Bearing Restrictions: Yes RLE Weight Bearing: Weight bearing as tolerated Other Position/Activity Restrictions: 2/2 IM nail 11/17/16 Pain: Pain Assessment Pain Assessment: No/denies pain  See Function Navigator for Current Functional Status.   Therapy/Group: Individual Therapy  Roney Mans [facility] 12/05/2016, 8:54 AM

## 2016-12-05 NOTE — Plan of Care (Signed)
Problem: RH Car Transfers Goal: LTG Patient will perform car transfers with assist (PT) LTG: Patient will perform car transfers with assistance (PT).  With LRAD; upgrade 2/2 progress  Problem: RH Furniture Transfers Goal: LTG Patient will perform furniture transfers w/assist (OT/PT LTG: Patient will perform furniture transfers  with assistance (OT/PT).  With LRAD; upgrade 2/2 progress  Problem: RH Ambulation Goal: LTG Patient will ambulate in controlled environment (PT) LTG: Patient will ambulate in a controlled environment, # of feet with assistance (PT).  100 ft with LRAD; upgrade 2/2 progress Goal: LTG Patient will ambulate in home environment (PT) LTG: Patient will ambulate in home environment, # of feet with assistance (PT).  50 ft with LRAD; upgrade 2/2 progress

## 2016-12-06 ENCOUNTER — Inpatient Hospital Stay (HOSPITAL_COMMUNITY): Payer: 59 | Admitting: Occupational Therapy

## 2016-12-06 ENCOUNTER — Inpatient Hospital Stay (HOSPITAL_COMMUNITY): Payer: 59 | Admitting: Speech Pathology

## 2016-12-06 ENCOUNTER — Inpatient Hospital Stay (HOSPITAL_COMMUNITY): Payer: 59 | Admitting: Physical Therapy

## 2016-12-06 ENCOUNTER — Inpatient Hospital Stay (HOSPITAL_COMMUNITY): Payer: 59

## 2016-12-06 DIAGNOSIS — F09 Unspecified mental disorder due to known physiological condition: Secondary | ICD-10-CM

## 2016-12-06 LAB — BASIC METABOLIC PANEL
Anion gap: 6 (ref 5–15)
BUN: 16 mg/dL (ref 6–20)
CALCIUM: 8.8 mg/dL — AB (ref 8.9–10.3)
CO2: 25 mmol/L (ref 22–32)
CREATININE: 1.09 mg/dL (ref 0.61–1.24)
Chloride: 105 mmol/L (ref 101–111)
GFR calc Af Amer: >60 mL/min (ref >60)
GFR calc non Af Amer: >60 mL/min (ref >60)
GLUCOSE: 102 mg/dL — AB (ref 65–99)
Potassium: 4.2 mmol/L (ref 3.5–5.1)
Sodium: 136 mmol/L (ref 135–145)

## 2016-12-06 LAB — GLUCOSE, CAPILLARY
Glucose-Capillary: 114 mg/dL — ABNORMAL HIGH (ref 65–99)
Glucose-Capillary: 118 mg/dL — ABNORMAL HIGH (ref 65–99)
Glucose-Capillary: 113 mg/dL — ABNORMAL HIGH (ref 65–99)
Glucose-Capillary: 132 mg/dL — ABNORMAL HIGH (ref 65–99)

## 2016-12-06 NOTE — Progress Notes (Signed)
Speech Language Pathology Daily Session Note  Patient Details  Name: Jerry Zimmerman MRN: [id number] Date of Birth: 08-14-1940  Today's Date: 12/06/2016 SLP Individual Time: 1478-2956 SLP Individual Time Calculation (min): 58 min  Short Term Goals: Week 2: SLP Short Term Goal 1 (Week 2): Patient will demonstrate functional problem solving for basic and familair tasks with supervision verbal cues.  SLP Short Term Goal 2 (Week 2): Patient will recall new, daily information with supervision verbal cues.  SLP Short Term Goal 3 (Week 2): Patient will demonstrate emergent awareness into difficulty of task and self-monitor and correct errors with supervision verbal cues.  SLP Short Term Goal 4 (Week 2): Patient will self-monitor and correct verbosity with Min A verbal cues.   Skilled Therapeutic Interventions: Skilled treatment session focused on cognitive goals. Upon arrival, patient supine in bed but transferred to the wheelchair with supervision. Patient recalled events from previous therapy sessions with supervision verbal and required supervision verbal cues for problem solving with basic and familiar tasks. Patient with increased verbosity this session which he demonstrated intermittent emergent awareness of in order to redirect himself to task. Patient left upright in wheelchair with daughter present. Continue with current plan of care.      Function:   Cognition Comprehension Comprehension assist level: Follows basic conversation/direction with no assist  Expression   Expression assist level: Expresses basic needs/ideas: With no assist  Social Interaction Social Interaction assist level: Interacts appropriately 75 - 89% of the time - Needs redirection for appropriate language or to initiate interaction.  Problem Solving Problem solving assist level: Solves basic 75 - 89% of the time/requires cueing 10 - 24% of the time  Memory Memory assist level: Recognizes or recalls 75 - 89% of the  time/requires cueing 10 - 24% of the time;Requires cues to use assistive device    Pain No/Denies Pain   Therapy/Group: Individual Therapy  [name] 12/06/2016, 3:02 PM

## 2016-12-06 NOTE — Progress Notes (Signed)
Occupational Therapy Session Note  Patient Details  Name: Jerry Zimmerman MRN: [id number] Date of Birth: [birth year]  Today's Date: 12/06/2016 OT Individual Time: 1100-1200 OT Individual Time Calculation (min): 60 min    Short Term Goals: Week 1:  OT Short Term Goal 1 (Week 1): Pt will engaged in 10 minutes of functional tasks with 2 rest breaks or less of 1 minute each.  OT Short Term Goal 2 (Week 1): Pt will perform bathing at shower level with min A in order to increase indepence with self care. OT Short Term Goal 3 (Week 1): Pt will perform shower transfer with min A.  OT Short Term Goal 4 (Week 1): Pt will perform toilet transfer with min A.  Week 2:     Skilled Therapeutic Interventions/Progress Updates: Continued discussion about w/c home with pt.  Pt reports (and was later confirmed with daughter) there are two entrances to his apartment home and depending on where he parks. At one entrance is 15 steps with one right hand rail and at the other entrance is 5 stairs without a rail.  In the stairwell we practice 5 steps with a right rail with min to mod A with VC for sequencing up and down the stairs. Then we practiced with the RW up and down without rail with min A. Pt able to ambulate back to his room with distant supervision and get into bed mod I.      Therapy Documentation Precautions:  Precautions Precautions: Fall Precaution Comments: R SAH Restrictions Weight Bearing Restrictions: Yes RLE Weight Bearing: Weight bearing as tolerated Other Position/Activity Restrictions: 2/2 IM nail 11/17/16 General:   Vital Signs: Therapy Vitals Temp: 98.1 F (36.7 C) Temp Source: Oral Pulse Rate: 73 Resp: 17 BP: 120/64 Patient Position (if appropriate): Lying Oxygen Therapy SpO2: 100 % O2 Device: Not Delivered Pain:   ADL:   Vision/Perception     Exercises:   Other Treatments:    See Function Navigator for Current Functional Status.   Therapy/Group: Individual  Therapy  Roney Mans [facility] 12/06/2016, 4:18 PM

## 2016-12-06 NOTE — Progress Notes (Addendum)
Physical Therapy Note  Patient Details  Name: Jerry Zimmerman MRN: [id number] Date of Birth: 1940-10-01 Today's Date: 12/06/2016  0900-1000, 60 min individual tx Pain: : a little bi" R hip, refused pain meds  Bed mobility without use of rail, supervision.  Squat pivot transfers throughout session safely with supervision. Gait on level tile x 100' and on carpet with multiple turns x 25', supervision.  10 MWT = 39 seconds; 0.84'/sec. In ADL apt, on regular bed, mobility with supervision, without cues.  Neuromuscular re-education with multimodal cues for gentle bil lower trunk rotation, R/L straight leg raises, bil bridging.  Pt managed placing pillow cases on hangars in standing x 1 minute, retrieved items from kitchen cabinet x 3 mintues, closed door behind him as he exited room.  Pt needed min/mod cues for safe use of RW and keeping it with him, during these activities and sit>< stand. Pt stated that the first thing he would do before stepping into kitchen was check the floor for oil or water, as a safety precaustion.  Pt left resting in w/c with quick release belt applied and all needs within reach.  See function navigator for current status.   Suhaila Troiano 12/06/2016, 8:47 AM

## 2016-12-06 NOTE — Progress Notes (Signed)
Occupational Therapy Session Note  Patient Details  Name: Jerry Zimmerman MRN: [id number] Date of Birth: 1941-02-06  Today's Date: 12/06/2016 OT Individual Time: 1303-1400 OT Individual Time Calculation (min): 57 min   Short Term Goals: Week 1:  OT Short Term Goal 1 (Week 1): Pt will engaged in 10 minutes of functional tasks with 2 rest breaks or less of 1 minute each.  OT Short Term Goal 2 (Week 1): Pt will perform bathing at shower level with min A in order to increase indepence with self care. OT Short Term Goal 3 (Week 1): Pt will perform shower transfer with min A.  OT Short Term Goal 4 (Week 1): Pt will perform toilet transfer with min A.   Skilled Therapeutic Interventions/Progress Updates:    OT treatment session focused on functional problem solving within iADL kicthen environment. Pt ambulated to therapy apartment with increased time, supervision, and VC to avoid objects on R side.  Pt educated on proper walker placement during meal prep, including fridge and pantry access, was well as technique for transporting items safely.Pt given 3 step task to complete in kitchen for simulated meal prep task using RW and walker bag. Pt able to recall 2/3 steps without cues. Addressed dynamic standing balance/endurance with dish washing task + min cues for RW positioning. Pt returned to room at end of session and transferred back to bed with supervision. Pt left with bed alarm on and family present.   Therapy Documentation Precautions:  Precautions Precautions: Fall Precaution Comments: R SAH Restrictions Weight Bearing Restrictions: Yes RLE Weight Bearing: Weight bearing as tolerated Other Position/Activity Restrictions: 2/2 IM nail 11/17/16 Pain:  none/denies pain  See Function Navigator for Current Functional Status.   Therapy/Group: Individual Therapy  Mal Amabile 12/06/2016, 1:29 PM
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
## 2016-12-06 NOTE — Progress Notes (Signed)
Physical Therapy Session Note  Patient Details  Name: Jerry Zimmerman MRN: [id number] Date of Birth: 11/10/1940  Today's Date: 12/06/2016 PT Individual Time: 1035-1100 PT Individual Time Calculation (min): 25 min   Short Term Goals: Week 2:  PT Short Term Goal 1 (Week 2): STG = LTG due to estimated d/c date.  Skilled Therapeutic Interventions/Progress Updates:   Patient in wheelchair upon arrival. Sit <> stand transfers using RW with supervision, no cues. Gait training using RW  2 x 200 ft with supervision and min verbal cues for safe management of RW especially keeping BLE within RW. Engaged in [employer] to challenge dynamic standing balance/reaching, 2 trials of 2 min each. First trial with single UE support on RW with score of 51 hits and second trial without UE support with score of 70 hits and no LOB noted. Patient required seated rest between trials. Patient left sitting in wheelchair with quick release belt donned and needs in reach.   Therapy Documentation Precautions:  Precautions Precautions: Fall Precaution Comments: R SAH Restrictions Weight Bearing Restrictions: Yes RLE Weight Bearing: Weight bearing as tolerated Other Position/Activity Restrictions: 2/2 IM nail 11/17/16 Pain: Pain Assessment Pain Assessment: 0-10 Pain Score: 2  Pain Type: Acute pain Pain Location: Leg Pain Orientation: Right Pain Descriptors / Indicators: Aching Pain Onset: On-going Pain Intervention(s): Rest;Ambulation/increased activity  See Function Navigator for Current Functional Status.   Therapy/Group: Individual Therapy  Louann Hopson, Prudencio Pair 12/06/2016, 11:48 AM

## 2016-12-06 NOTE — Progress Notes (Signed)
Social Work Patient ID: Jerry Zimmerman, male   DOB: 1940/12/26, 76 y.o.   MRN: [id number]   Met with tx today to discuss new target d/c date.  They feel team could plan toward 5/2.  Have informed pt and daughter who are agreeable.  Alric Geise, LCSW

## 2016-12-06 NOTE — Plan of Care (Signed)
Problem: RH SKIN INTEGRITY Goal: RH STG SKIN FREE OF INFECTION/BREAKDOWN Skin to remain free from infection and breakdown while on rehab with min assist.  Outcome: Progressing No signs of infection noted Goal: RH STG ABLE TO PERFORM INCISION/WOUND CARE W/ASSISTANCE STG Able To Perform Right Leg Incision Care With Mod Assistance.  Outcome: Progressing Right hip incision is healed, egdes well approximated  Problem: RH SAFETY Goal: RH STG ADHERE TO SAFETY PRECAUTIONS W/ASSISTANCE/DEVICE STG Adhere to Safety Precautions With Mod Assistance and appropriate assistive Device, pt and pt family will verbalize safety precautions prior to discharge.  No safety issues noted this shift  Problem: RH PAIN MANAGEMENT Goal: RH STG PAIN MANAGED AT OR BELOW PT'S PAIN GOAL <4 on a 0-10 pain scale  Outcome: Progressing Denies pain this shift

## 2016-12-06 NOTE — Discharge Summary (Signed)
Discharge summary job # [id number]

## 2016-12-07 ENCOUNTER — Inpatient Hospital Stay (HOSPITAL_COMMUNITY): Payer: 59 | Admitting: Occupational Therapy

## 2016-12-07 ENCOUNTER — Inpatient Hospital Stay (HOSPITAL_COMMUNITY): Payer: 59 | Admitting: *Deleted

## 2016-12-07 ENCOUNTER — Inpatient Hospital Stay (HOSPITAL_COMMUNITY): Payer: 59 | Admitting: Speech Pathology

## 2016-12-07 LAB — GLUCOSE, CAPILLARY
GLUCOSE-CAPILLARY: 100 mg/dL — AB (ref 65–99)
GLUCOSE-CAPILLARY: 143 mg/dL — AB (ref 65–99)
GLUCOSE-CAPILLARY: 243 mg/dL — AB (ref 65–99)
Glucose-Capillary: 110 mg/dL — ABNORMAL HIGH (ref 65–99)

## 2016-12-07 NOTE — Progress Notes (Signed)
Speech Language Pathology Daily Session Note  Patient Details  Name: Jerry Zimmerman MRN: [id number] Date of Birth: January 19, 1941  Today's Date: 12/07/2016 SLP Individual Time: 1100-1200 SLP Individual Time Calculation (min): 60 min  Short Term Goals: Week 2: SLP Short Term Goal 1 (Week 2): Patient will demonstrate functional problem solving for basic and familair tasks with supervision verbal cues.  SLP Short Term Goal 2 (Week 2): Patient will recall new, daily information with supervision verbal cues.  SLP Short Term Goal 3 (Week 2): Patient will demonstrate emergent awareness into difficulty of task and self-monitor and correct errors with supervision verbal cues.  SLP Short Term Goal 4 (Week 2): Patient will self-monitor and correct verbosity with Min A verbal cues.   Skilled Therapeutic Interventions: Skilled treatment session focused on cognitive goals. SLP facilitated session by providing Max A verbal cues for problem solving and organization with a mildly complex money management task. Suspect difficulty was due to decreased mental flexibility within the task. Patient transferred to and from the bed with supervision and propelled himself to and from the dayroom with intermittent Min verbal cues needed to attend to left field of environment. Patient left supine in bed with all needs within reach. Continue with current plan of care.   Function:  Cognition Comprehension Comprehension assist level: Follows basic conversation/direction with no assist  Expression   Expression assist level: Expresses basic needs/ideas: With no assist  Social Interaction Social Interaction assist level: Interacts appropriately 75 - 89% of the time - Needs redirection for appropriate language or to initiate interaction.  Problem Solving Problem solving assist level: Solves basic 75 - 89% of the time/requires cueing 10 - 24% of the time  Memory Memory assist level: Recognizes or recalls 75 - 89% of the time/requires  cueing 10 - 24% of the time    Pain Pain Assessment Pain Assessment: No/denies pain  Therapy/Group: Individual Therapy  Brittony Billick 12/07/2016, 2:46 PM
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
## 2016-12-07 NOTE — Plan of Care (Signed)
Problem: RH SKIN INTEGRITY Goal: RH STG ABLE TO PERFORM INCISION/WOUND CARE W/ASSISTANCE STG Able To Perform Right Leg Incision Care With Mod Assistance.  Outcome: Progressing All the incisions are completely healed  Problem: RH SAFETY Goal: RH STG ADHERE TO SAFETY PRECAUTIONS W/ASSISTANCE/DEVICE STG Adhere to Safety Precautions With Mod Assistance and appropriate assistive Device, pt and pt family will verbalize safety precautions prior to discharge.  Outcome: Progressing Safety precautions maintained Goal: RH STG DECREASED RISK OF FALL WITH ASSISTANCE STG Decreased Risk of Fall With min Assistance.  Outcome: Progressing No fall or injury this shift   Problem: RH PAIN MANAGEMENT Goal: RH STG PAIN MANAGED AT OR BELOW PT'S PAIN GOAL <4 on a 0-10 pain scale  Outcome: Progressing Denied pain

## 2016-12-07 NOTE — Progress Notes (Signed)
Occupational Therapy Session Note  Patient Details  Name: Jerry Zimmerman MRN: [id number] Date of Birth: [birth year]  Today's Date: 12/07/2016 OT Individual Time: 1440-1545 OT Individual Time Calculation (min): 65 min    Short Term Goals: Week 2:  OT Short Term Goal 1 (Week 2): STG = LTGs due to remaining LOS  Skilled Therapeutic Interventions/Progress Updates: Skilled OT session completed with focus on functional ambulation in community environment. Pt lying in bed at time of arrival, reported desire to continue walking for balance remediation. Pt ambulated over 500 ft with RW to [employer] main entrance with 2 seated rest breaks and supervision. Had him ambulate over uneven surfaces and negotiate around environmental barriers while outdoors (mod cues for problem solving way around barriers; he tried to push walker through small spaces between chairs and tables before requiring OT assist for safety). Afterwards he ambulated halfway back [employer], then was escorted in w/c for remainder of way to room. He ambulated short distance back to bed and was left with all needs and bed alarm activated.      Therapy Documentation Precautions:  Precautions Precautions: Fall Precaution Comments: R SAH Restrictions Weight Bearing Restrictions: Yes RLE Weight Bearing: Weight bearing as tolerated Other Position/Activity Restrictions: 2/2 IM nail 11/17/16 General:   Vital Signs: Therapy Vitals Temp: 98.6 F (37 C) Temp Source: Oral Pulse Rate: 82 Resp: 18 BP: 116/62 Patient Position (if appropriate): Lying Oxygen Therapy SpO2: 100 % O2 Device: Not Delivered Pain: No c/o pain during tx    ADL:      See Function Navigator for Current Functional Status.   Therapy/Group: Individual Therapy  Brandt Chaney A Janica Eldred 12/07/2016, 5:35 PM

## 2016-12-07 NOTE — Plan of Care (Signed)
Problem: RH Dressing Goal: LTG Patient will perform lower body dressing w/assist (OT) LTG: Patient will perform lower body dressing with assist, with/without cues in positioning using equipment (OT)  Goal downgraded due to family assist with footwear PTA

## 2016-12-07 NOTE — Progress Notes (Signed)
Occupational Therapy Weekly Progress Note  Patient Details  Name: Jerry Zimmerman MRN: [id number] Date of Birth: [birth year]  Beginning of progress report period: November 28, 2016 End of progress report period: December 07, 2016  Today's Date: 12/07/2016 OT Individual Time: 9166-0600 OT Individual Time Calculation (min): 60 min    Patient has met 4 of 4 short term goals.  Pt is making steady progress towards goals.  Pt currently requires min cues for hand placement with sit > stand as pt has tendency to pull up on RW.  Currently supervision with mobility and self-care tasks at sit > stand level, however requires assistance with donning socks - family reports they would assist him PTA.  Patient continues to demonstrate the following deficits: muscle weakness, decreased cardiorespiratoy endurance, decreased awareness, decreased safety awareness and decreased memory and decreased sitting balance, decreased standing balance and decreased balance strategies and therefore will continue to benefit from skilled OT intervention to enhance overall performance with BADL and Reduce care partner burden.  Patient progressing toward long term goals..  Continue plan of care.  OT Short Term Goals Week 1:  OT Short Term Goal 1 (Week 1): Pt will engaged in 10 minutes of functional tasks with 2 rest breaks or less of 1 minute each.  OT Short Term Goal 1 - Progress (Week 1): Met OT Short Term Goal 2 (Week 1): Pt will perform bathing at shower level with min A in order to increase indepence with self care. OT Short Term Goal 2 - Progress (Week 1): Met OT Short Term Goal 3 (Week 1): Pt will perform shower transfer with min A.  OT Short Term Goal 3 - Progress (Week 1): Met OT Short Term Goal 4 (Week 1): Pt will perform toilet transfer with min A.  OT Short Term Goal 4 - Progress (Week 1): Met Week 2:  OT Short Term Goal 1 (Week 2): STG = LTGs due to remaining LOS  Skilled Therapeutic Interventions/Progress Updates:   Treatment session with focus on ADL retraining with safety with mobility and endurance during bathing and dressing tasks.  Pt ambulated to room shower with RW, cues for hand placement with sit > stand as pt tends to pull up from RW.  Completed walk-in shower transfer with RW supervision.  Pt able to complete all sit <> stand without UE, demonstrating increased strength and control.  Issued elastic shoe laces with pt able to don shoes with increased time.  Engaged in discussion regarding d/c planning.  Therapy Documentation Precautions:  Precautions Precautions: Fall Precaution Comments: R SAH Restrictions Weight Bearing Restrictions: Yes RLE Weight Bearing: Weight bearing as tolerated Other Position/Activity Restrictions: 2/2 IM nail 11/17/16 General:   Vital Signs: Therapy Vitals Temp: 98.2 F (36.8 C) Temp Source: Oral Pulse Rate: 90 Resp: 18 BP: (!) 146/67 Patient Position (if appropriate): Lying Oxygen Therapy SpO2: 99 % O2 Device: Not Delivered Pain:  Pt with no c/o pain  See Function Navigator for Current Functional Status.   Therapy/Group: Individual Therapy  Simonne Come 12/07/2016, 8:15 AM

## 2016-12-07 NOTE — Progress Notes (Signed)
Physical Therapy Session Note  Patient Details  Name: Jerry Zimmerman MRN: [id number] Date of Birth: Aug 20, 1940  Today's Date: 12/07/2016 PT Individual Time: 4098-1191 and 1305-1405 PT Individual Time Calculation (min): 57 min and 60 min    Short Term Goals: Week 2:  PT Short Term Goal 1 (Week 2): STG = LTG due to estimated d/c date.  Skilled Therapeutic Interventions/Progress Updates:  First tx focused on functional mobility training, gait with RW, and NMR via forced use, manual facilitation, and multi-modal cues with [place] HEP for fall prevention.  Pt up in recliner, ready to go.  Sit<>stands with close S.  Gait in controlled settings x150' with close S and cues for posture and decreasing UE reliance on RW as able.  Nustep x8 min (4 with all extremities and with LEs only) Pt instructed in [place] Fall Prevention HEP with handout bil x10 each with 2 seated rest breaks and cues for technique.  Stair training 2x12 with bil rails for strengthening with S cues for sequence. Pt able to perform 4" steps reciprocally in safe manner, step-to pattern on 6." Pt reports plan for level home entry.   Pt left EOB with alarm on and waiting for next therapy.     Second tx focused on community gait and mobility.HPI Pt participated in community gait and mobility 4x200' with RW and close S over varying and uneven surfaces, curb step, and inclines.  Pt able to navigate busy settings and tight spaces with S. Pt had no LOB, but needed 3 seated rest breaks. Discussed typical community access and trouble shooting possible difficulties. Performed transfers from varying surfaces and furniture. Pt performed 16 steps with R rail only with distant S, safe step-to sequence.     Therapy Documentation Precautions:  Precautions Precautions: Fall Precaution Comments: R SAH Restrictions Weight Bearing Restrictions: Yes RLE Weight Bearing: Weight bearing as tolerated Other Position/Activity Restrictions: 2/2 IM nail  11/17/16 General:   Vital Signs:  Pain: none     See Function Navigator for Current Functional Status.   Therapy/Group: Individual Therapy  Jyra Lagares Virl Cagey, PT, DPT  12/07/2016, 9:20 AM

## 2016-12-08 ENCOUNTER — Inpatient Hospital Stay (HOSPITAL_COMMUNITY): Payer: 59

## 2016-12-08 LAB — GLUCOSE, CAPILLARY
GLUCOSE-CAPILLARY: 113 mg/dL — AB (ref 65–99)
GLUCOSE-CAPILLARY: 110 mg/dL — AB (ref 65–99)
Glucose-Capillary: 112 mg/dL — ABNORMAL HIGH (ref 65–99)
Glucose-Capillary: 93 mg/dL (ref 65–99)
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
## 2016-12-08 NOTE — Progress Notes (Signed)
Occupational Therapy Session Note  Patient Details  Name: Jerry Zimmerman MRN: [id number] Date of Birth: July 16, 1941  Today's Date: 12/08/2016 OT Individual Time: 1030-1100 OT Individual Time Calculation (min): 30 min   Short Term Goals: Week 2:  OT Short Term Goal 1 (Week 2): STG = LTGs due to remaining LOS  Skilled Therapeutic Interventions/Progress Updates: ADL-retraining at shower level with focus on improved endurance, dynamic standing balance, and adapted bathing/dressing.   Pt requested expedient bathing/dressing session despite time limitation with planned visit.   Pt now moving well and able to reach his feet w/o discomfort.   OT provided setup and supervision for safety as patient completed bed mobility, transfer to RW, and transfers on/off tub bench to complete bathing and dressing unassisted.   Pt recovered to recliner at end of session with all needs placed within reach.     Therapy Documentation Precautions:  Precautions Precautions: Fall Precaution Comments: R SAH Restrictions Weight Bearing Restrictions: Yes RLE Weight Bearing: Weight bearing as tolerated Other Position/Activity Restrictions: 2/2 IM nail 11/17/16   Pain: Pain Assessment Pain Assessment: No/denies pain  See Function Navigator for Current Functional Status.   Therapy/Group: Individual Therapy  Tyriana Helmkamp 12/08/2016, 12:19 PM

## 2016-12-09 ENCOUNTER — Inpatient Hospital Stay (HOSPITAL_COMMUNITY): Payer: 59 | Admitting: Physical Therapy

## 2016-12-09 ENCOUNTER — Inpatient Hospital Stay (HOSPITAL_COMMUNITY): Payer: 59 | Admitting: Occupational Therapy

## 2016-12-09 LAB — GLUCOSE, CAPILLARY
GLUCOSE-CAPILLARY: 127 mg/dL — AB (ref 65–99)
Glucose-Capillary: 99 mg/dL (ref 65–99)
Glucose-Capillary: 110 mg/dL — ABNORMAL HIGH (ref 65–99)
Glucose-Capillary: 123 mg/dL — ABNORMAL HIGH (ref 65–99)
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
## 2016-12-09 NOTE — Progress Notes (Signed)
Occupational Therapy Session Note  Patient Details  Name: Jerry Zimmerman MRN: [id number] Date of Birth: [birth year]  Today's Date: 12/09/2016 OT Individual Time: 8295-6213 OT Individual Time Calculation (min): 31 min   Skilled Therapeutic Interventions/Progress Updates:  Tx focus on functional ambulation and cognitive remediation.   Pt greeted lying in bed. He donned shoes at EOB with setup, then ambulated down hallway with RW and supervision to dayroom. Pt engaged in watering plants, cuing for short term memory recall and safe walker placement. Pt using walker bag to transport empty can to family room to fill at sink, and OT transported full can. At end of tx pt ambulated back to room, able to pathfind his way back without cues. Pt EOB when PT arrived for handoff.   Therapy Documentation Precautions:  Precautions Precautions: Fall Precaution Comments: R SAH Restrictions Weight Bearing Restrictions: Yes RLE Weight Bearing: Weight bearing as tolerated Other Position/Activity Restrictions: 2/2 IM nail 11/17/16   Vital Signs: Therapy Vitals Temp: 98.5 F (36.9 C) Temp Source: Oral Pulse Rate: 75 Resp: 18 BP: 127/63 Patient Position (if appropriate): Lying Oxygen Therapy SpO2: 100 % O2 Device: Not Delivered Pain: No c/o pain during tx    ADL:  :    See Function Navigator for Current Functional Status.   Therapy/Group: Individual Therapy  Natsumi Whitsitt A Gwendola Hornaday 12/09/2016, 4:23 PM
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
## 2016-12-10 ENCOUNTER — Inpatient Hospital Stay (HOSPITAL_COMMUNITY): Payer: 59 | Admitting: Occupational Therapy

## 2016-12-10 ENCOUNTER — Inpatient Hospital Stay (HOSPITAL_COMMUNITY): Payer: 59 | Admitting: Physical Therapy

## 2016-12-10 ENCOUNTER — Inpatient Hospital Stay (HOSPITAL_COMMUNITY): Payer: 59 | Admitting: Speech Pathology

## 2016-12-10 LAB — GLUCOSE, CAPILLARY
GLUCOSE-CAPILLARY: 121 mg/dL — AB (ref 65–99)
GLUCOSE-CAPILLARY: 114 mg/dL — AB (ref 65–99)
Glucose-Capillary: 93 mg/dL (ref 65–99)

## 2016-12-10 NOTE — Plan of Care (Signed)
Problem: RH Balance Goal: LTG: Patient will maintain dynamic sitting balance (OT) LTG:  Patient will maintain dynamic sitting balance with assistance during activities of daily living (OT)  Upgraded due to progress Goal: LTG Patient will maintain dynamic standing with ADLs (OT) LTG:  Patient will maintain dynamic standing balance with assist during activities of daily living (OT)   Upgraded due to progress  Problem: RH Grooming Goal: LTG Patient will perform grooming w/assist,cues/equip (OT) LTG: Patient will perform grooming with assist, with/without cues using equipment (OT)  Upgraded due to progress   Problem: RH Bathing Goal: LTG Patient will bathe with assist, cues/equipment (OT) LTG: Patient will bathe specified number of body parts with assist with/without cues using equipment (position)  (OT)  Upgraded due to progress   Problem: RH Dressing Goal: LTG Patient will perform upper body dressing (OT) LTG Patient will perform upper body dressing with assist, with/without cues (OT).  Upgraded due to progress   Problem: RH Toileting Goal: LTG Patient will perform toileting w/assist, cues/equip (OT) LTG: Patient will perform toiletiing (clothes management/hygiene) with assist, with/without cues using equipment (OT)  Upgraded due to progress   Problem: RH Toilet Transfers Goal: LTG Patient will perform toilet transfers w/assist (OT) LTG: Patient will perform toilet transfers with assist, with/without cues using equipment (OT)  Upgraded due to progress   Problem: RH Tub/Shower Transfers Goal: LTG Patient will perform tub/shower transfers w/assist (OT) LTG: Patient will perform tub/shower transfers with assist, with/without cues using equipment (OT)  Upgraded due to progress

## 2016-12-10 NOTE — Progress Notes (Signed)
Physical Therapy Session Note  Patient Details  Name: Jerry Zimmerman MRN: [id number] Date of Birth: Feb 01, 1941  Today's Date: 12/10/2016 PT Individual Time: 2440-1027 and 1407-1500 and 2536-6440 PT Individual Time Calculation (min): 30 min and 53 min and 54 min  Short Term Goals: Week 2:  PT Short Term Goal 1 (Week 2): STG = LTG due to estimated d/c date.  Skilled Therapeutic Interventions/Progress Updates:  Treatment 1: Pt received in recliner & agreeable to tx. No c/o pain noted. Pt ambulated room<>gym with step-through and more reciprocal gait pattern with use of RW. 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 Pt did require one seated rest break 2/2 fatigue. At end of session pt left in recliner in room with chair alarm set & all needs within reach.   Treatment 2: Pt received in bed & agreeable to tx. No c/o pain noted during session. Pt transferred supine>sitting EOB with mod I & HOB elevated. Pt donned shoes without assistance and ambulated room>ortho gym>family room with RW & mod I with reciprocal gait pattern and significantly reduced gait speed. Pt completed Berg Balance Test & scored 40/56; educated pt on interpretation of score & current fall risk. Patient demonstrates increased fall risk as noted by score of 40/56 on Berg Balance Scale, but this is a significant improvement from score of 18/56 about a week ago.  (<36= high risk for falls, close to 100%; 37-45 significant >80%; 46-51 moderate >50%; 52-55 lower >25%). At end of session pt left in handoff to OT.   Treatment 3: Pt received in recliner & agreeable to tx. No c/o pain noted. Educated pt on grad day plans tomorrow, as well as other d/c plans. Discussed heart healthy diet with pt. Pt then ambulated throughout room<>bathroom with RW and distant supervision progressing to Mod I to simulate negotiating cluttered room without assistance. Pt able to open bathroom door  without assistance. Pt ambulated room<>gym with RW & distant supervision and more than reasonable amount of time. Pt's gait speed appears to decrease with fatigue. Pt utilized nu-step up to level 4 x 10 minutes with BLE only with task focusing on strength & endurance training. Discussed d/c and pt reports no concerns regarding d/c Wednesday; educated pt on home modifications to reduce tripping hazards.  At end of session pt left in recliner with all needs within reach & chair alarm set.   Therapy Documentation Precautions:  Precautions Precautions: Fall Precaution Comments: R SAH Restrictions Weight Bearing Restrictions: Yes RLE Weight Bearing: Weight bearing as tolerated Other Position/Activity Restrictions: 2/2 IM nail 11/17/16   Balance: Balance Balance Assessed: Yes Standardized Balance Assessment Standardized Balance Assessment: Berg Balance Test Berg Balance Test Sit to Stand: Able to stand without using hands and stabilize independently Standing Unsupported: Able to stand 2 minutes with supervision Sitting with Back Unsupported but Feet Supported on Floor or Stool: Able to sit safely and securely 2 minutes Stand to Sit: Sits safely with minimal use of hands Transfers: Able to transfer safely, definite need of hands Standing Unsupported with Eyes Closed: Able to stand 10 seconds with supervision Standing Ubsupported with Feet Together: Able to place feet together independently and stand for 1 minute with supervision From Standing, Reach Forward with Outstretched Arm: Can reach confidently >25 cm (10") From Standing Position, Pick up Object from Floor: Able to pick up shoe, needs supervision From Standing Position, Turn to Look Behind Over each Shoulder: Looks behind from both sides  and weight shifts well Turn 360 Degrees: Able to turn 360 degrees safely but slowly Standing Unsupported, Alternately Place Feet on Step/Stool: Needs assistance to keep from falling or unable to try  (unable to complete 2/2 RLE pain) Standing Unsupported, One Foot in Front: Able to take small step independently and hold 30 seconds Standing on One Leg: Tries to lift leg/unable to hold 3 seconds but remains standing independently Total Score: 40   See Function Navigator for Current Functional Status.   Therapy/Group: Individual Therapy  Sandi Mariscal 12/10/2016, 5:14 PM
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
## 2016-12-10 NOTE — Progress Notes (Signed)
Speech Language Pathology Daily Session Note  Patient Details  Name: Jerry Zimmerman MRN: [id number] Date of Birth: [birth year]  Today's Date: 12/10/2016 SLP Individual Time: 1324-4010 SLP Individual Time Calculation (min): 45 min  Short Term Goals: Week 2: SLP Short Term Goal 1 (Week 2): Patient will demonstrate functional problem solving for basic and familair tasks with supervision verbal cues.  SLP Short Term Goal 2 (Week 2): Patient will recall new, daily information with supervision verbal cues.  SLP Short Term Goal 3 (Week 2): Patient will demonstrate emergent awareness into difficulty of task and self-monitor and correct errors with supervision verbal cues.  SLP Short Term Goal 4 (Week 2): Patient will self-monitor and correct verbosity with Min A verbal cues.   Skilled Therapeutic Interventions: Skilled treatment session focused on cognitive goals. SLP facilitated session by administering sub tests of the MoCA (version 7.3). Patient scored 5/5 points on visual spatial and executive functioning tasks, 3/3 on naming tasks, 3/3 on attention tasks, 2/2 on abstract reasoning tasks, 3/5 on delayed recall tasks and 5/6 on orientation. Will complete MoCA at next scheduled session. Continue with current plan of care.      Function:    Cognition Comprehension Comprehension assist level: Follows basic conversation/direction with no assist  Expression   Expression assist level: Expresses basic needs/ideas: With no assist  Social Interaction Social Interaction assist level: Interacts appropriately 75 - 89% of the time - Needs redirection for appropriate language or to initiate interaction.  Problem Solving Problem solving assist level: Solves basic 75 - 89% of the time/requires cueing 10 - 24% of the time  Memory Memory assist level: Recognizes or recalls 75 - 89% of the time/requires cueing 10 - 24% of the time    Pain No/Denies   Therapy/Group: Individual Therapy  Sheala Dosh,  Jayquan Bradsher 12/10/2016, 3:07 PM

## 2016-12-10 NOTE — Progress Notes (Signed)
Occupational Therapy Session Note  Patient Details  Name: Jerry Zimmerman MRN: [id number] Date of Birth: 05-26-1941  Today's Date: 12/10/2016 OT Individual Time: 1002-1059 and 1500-1535 OT Individual Time Calculation (min): 57 min and 35 min  Short Term Goals: Week 2:  OT Short Term Goal 1 (Week 2): STG = LTGs due to remaining LOS    Skilled Therapeutic Interventions/Progress Updates:   Tx focus on ADL retraining, balance, and walker safety.   Pt greeted in recliner. He ambulated with RW into bathroom with supervision. Pt showered while seated on tub bench with distant supervision. Min cues for walker safety when dressing at edge of tub bench and standing with DME PRN. Pt donned sandals and ambulated back to bed afterwards. Discussed DME needs for home. Pt left in semi reclined position at time of departure. All needs within reach and bed alarm activated.   2nd Session 1:1 tx (35 min) Tx focus on UB strengthening and functional mobility.   Pt received via PT handoff. He ambulated down hallway to therapy apartment with supervision and RW. Pt completed bilateral UE exercises with orange theraband while seated with focus on strengthening deltoids/biceps/triceps. Afterwards pt ambulated back to room and was left in recliner with friend present. Chair alarm activated and all needs within reach at time of departure.      Therapy Documentation Precautions:  Precautions Precautions: Fall Precaution Comments: R SAH Restrictions Weight Bearing Restrictions: Yes RLE Weight Bearing: Weight bearing as tolerated Other Position/Activity Restrictions: 2/2 IM nail 11/17/16     Pain: No c/o pain during tx sessions   ADL:  :    See Function Navigator for Current Functional Status.   Therapy/Group: Individual Therapy  Bartholomew Ramesh A Luda Charbonneau 12/10/2016, 12:46 PM

## 2016-12-11 ENCOUNTER — Inpatient Hospital Stay (HOSPITAL_COMMUNITY): Payer: 59 | Admitting: Physical Therapy

## 2016-12-11 ENCOUNTER — Inpatient Hospital Stay (HOSPITAL_COMMUNITY): Payer: 59 | Admitting: Occupational Therapy

## 2016-12-11 ENCOUNTER — Inpatient Hospital Stay (HOSPITAL_COMMUNITY): Payer: 59 | Admitting: Speech Pathology

## 2016-12-11 LAB — GLUCOSE, CAPILLARY
GLUCOSE-CAPILLARY: 107 mg/dL — AB (ref 65–99)
GLUCOSE-CAPILLARY: 89 mg/dL (ref 65–99)
GLUCOSE-CAPILLARY: 93 mg/dL (ref 65–99)
GLUCOSE-CAPILLARY: 101 mg/dL — AB (ref 65–99)

## 2016-12-11 MED ORDER — LISINOPRIL 10 MG PO TABS: 10.0000 mg | ORAL_TABLET | Freq: Every day | ORAL | 1 refills | Status: DC

## 2016-12-11 MED ORDER — LEVETIRACETAM 500 MG PO TABS: 500.0000 mg | ORAL_TABLET | Freq: Two times a day (BID) | ORAL | 1 refills | Status: DC

## 2016-12-11 MED ORDER — DICLOFENAC SODIUM 1 % TD GEL: 2.0000 g | Freq: Four times a day (QID) | TRANSDERMAL | 1 refills | Status: DC

## 2016-12-11 MED ORDER — METFORMIN HCL 500 MG PO TABS: 250.0000 mg | ORAL_TABLET | Freq: Every day | ORAL | 1 refills | Status: DC

## 2016-12-11 MED ORDER — GEMFIBROZIL 600 MG PO TABS: ORAL_TABLET | ORAL | 3 refills | Status: DC

## 2016-12-11 MED ORDER — METFORMIN HCL 500 MG PO TABS: 500.0000 mg | ORAL_TABLET | Freq: Every day | ORAL | 1 refills | Status: DC

## 2016-12-11 MED ORDER — TRAMADOL HCL 50 MG PO TABS: 50.0000 mg | ORAL_TABLET | Freq: Four times a day (QID) | ORAL | 0 refills | Status: DC | PRN

## 2016-12-11 MED ORDER — CLONAZEPAM 0.5 MG PO TABS: 0.5000 mg | ORAL_TABLET | Freq: Two times a day (BID) | ORAL | 0 refills | Status: DC

## 2016-12-11 MED ORDER — TAMSULOSIN HCL 0.4 MG PO CAPS: 0.4000 mg | ORAL_CAPSULE | Freq: Every day | ORAL | 1 refills | Status: DC

## 2016-12-11 NOTE — Plan of Care (Signed)
Problem: RH Bed to Chair Transfers Goal: LTG Patient will perform bed/chair transfers w/assist (PT) LTG: Patient will perform bed/chair transfers with assistance, with/without cues (PT).  Outcome: Completed/Met Date Met: 12/11/16 Ambulatory with RW  Problem: RH Car Transfers Goal: LTG Patient will perform car transfers with assist (PT) LTG: Patient will perform car transfers with assistance (PT).  Outcome: Not Met (add Reason) Requires cuing for safe management of RW & overall technique  Problem: RH Ambulation Goal: LTG Patient will ambulate in controlled environment (PT) LTG: Patient will ambulate in a controlled environment, # of feet with assistance (PT).  Outcome: Completed/Met Date Met: 12/11/16 150 ft with RW Goal: LTG Patient will ambulate in home environment (PT) LTG: Patient will ambulate in home environment, # of feet with assistance (PT).  Outcome: Completed/Met Date Met: 12/11/16 50 ft with RW  Problem: RH Stairs Goal: LTG Patient will ambulate up and down stairs w/assist (PT) LTG: Patient will ambulate up and down # of stairs with assistance (PT)  Outcome: Completed/Met Date Met: 12/11/16 15 steps laterally with R rail for home access

## 2016-12-11 NOTE — Progress Notes (Signed)
Speech Language Pathology Daily Session Note  Patient Details  Name: Jerry Zimmerman MRN: [id number] Date of Birth: 1941/01/27  Today's Date: 12/11/2016 SLP Individual Time: 1115-1200 SLP Individual Time Calculation (min): 45 min  Short Term Goals: Week 2: SLP Short Term Goal 1 (Week 2): Patient will demonstrate functional problem solving for basic and familair tasks with supervision verbal cues.  SLP Short Term Goal 2 (Week 2): Patient will recall new, daily information with supervision verbal cues.  SLP Short Term Goal 3 (Week 2): Patient will demonstrate emergent awareness into difficulty of task and self-monitor and correct errors with supervision verbal cues.  SLP Short Term Goal 4 (Week 2): Patient will self-monitor and correct verbosity with Min A verbal cues.   Skilled Therapeutic Interventions: Skilled treatment session focused on cognitive goals. SLP facilitated session by completing the MoCA-Version 7.3. Patient scored 26/30 points with a score of 26 or above considered normal. Patient continues to demonstrate mild impairments in immediate and short-term recall.  SLP also facilitated a functional conversation that focused on anticipatory awareness and generating a list of activities the patient can safely perform at home. Patient completed task with supervision verbal cues. Patient left upright in recliner with all needs within reach. Continue with current plan of care.      Function:  Cognition Comprehension Comprehension assist level: Understands complex 90% of the time/cues 10% of the time  Expression   Expression assist level: Expresses complex 90% of the time/cues < 10% of the time  Social Interaction Social Interaction assist level: Interacts appropriately with others with medication or extra time (anti-anxiety, antidepressant).  Problem Solving Problem solving assist level: Solves basic 90% of the time/requires cueing < 10% of the time  Memory Memory assist level: Recognizes or  recalls 90% of the time/requires cueing < 10% of the time    Pain No/Denies Pain   Therapy/Group: Individual Therapy  Priyah Schmuck 12/11/2016, 2:08 PM
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
## 2016-12-11 NOTE — Progress Notes (Signed)
Occupational Therapy Discharge Summary  Patient Details  Name: Jerry Zimmerman MRN: [id number] Date of Birth: Jul 06, 1941  Patient has met 61 of 67 long term goals due to improved activity tolerance, improved balance, postural control, ability to compensate for deficits, improved attention, improved awareness and improved coordination.  Patient to discharge at overall Modified Independent level (except for S with shower stall transfers).  Patient's care partner is independent to provide the necessary physical and cognitive assistance at discharge.    Reasons goals not met: n/a  Recommendation:  Patient will benefit from ongoing skilled OT services in home health setting to continue to advance functional skills in the area of BADL and iADL.  Equipment: shower chair  Reasons for discharge: treatment goals met  Patient/family agrees with progress made and goals achieved: Yes  OT Discharge ADL ADL ADL Comments: mod I with RW, except for shower stall transfer pt needs S Vision/Perception = WFL  Cognition Overall Cognitive Status: Impaired/Different from baseline Arousal/Alertness: Awake/alert Orientation Level: Oriented X4 Safety/Judgment: Appears intact Sensation Sensation Stereognosis: Appears Intact Hot/Cold: Appears Intact Proprioception: Appears Intact Coordination Gross Motor Movements are Fluid and Coordinated: Yes Fine Motor Movements are Fluid and Coordinated: Yes Motor  Motor Motor: Within Functional Limits Mobility  Bed Mobility Bed Mobility: Rolling Left;Rolling Right;Supine to Sit;Sit to Supine Rolling Right: 6: Modified independent (Device/Increase time) Rolling Left: 6: Modified independent (Device/Increase time) Supine to Sit: 6: Modified independent (Device/Increase time) Sit to Supine: 6: Modified independent (Device/Increase time) Transfers Sit to Stand: 6: Modified independent (Device/Increase time) Stand to Sit: 6: Modified independent (Device/Increase  time)  Trunk/Postural Assessment  Cervical Assessment Cervical Assessment: Within Functional Limits Thoracic Assessment Thoracic Assessment: Within Functional Limits Lumbar Assessment Lumbar Assessment: Within Functional Limits Postural Control Postural Control: Within Functional Limits  Balance Balance Balance Assessed: Yes Standardized Balance Assessment Standardized Balance Assessment: Berg Balance Test;Timed Up and Go Test Berg Balance Test Sit to Stand: Able to stand without using hands and stabilize independently Standing Unsupported: Able to stand 2 minutes with supervision Sitting with Back Unsupported but Feet Supported on Floor or Stool: Able to sit safely and securely 2 minutes Stand to Sit: Sits safely with minimal use of hands Transfers: Able to transfer safely, definite need of hands Standing Unsupported with Eyes Closed: Able to stand 10 seconds with supervision Standing Ubsupported with Feet Together: Able to place feet together independently and stand for 1 minute with supervision From Standing, Reach Forward with Outstretched Arm: Can reach confidently >25 cm (10") From Standing Position, Pick up Object from Floor: Able to pick up shoe, needs supervision From Standing Position, Turn to Look Behind Over each Shoulder: Looks behind from both sides and weight shifts well Turn 360 Degrees: Able to turn 360 degrees safely but slowly Standing Unsupported, Alternately Place Feet on Step/Stool: Needs assistance to keep from falling or unable to try (unable to complete 2/2 RLE pain) Standing Unsupported, One Foot in Front: Able to take small step independently and hold 30 seconds Standing on One Leg: Tries to lift leg/unable to hold 3 seconds but remains standing independently Total Score: 40 Timed Up and Go Test TUG: Normal TUG Normal TUG (seconds): 32 (with RW) Dynamic Sitting Balance Dynamic Sitting - Level of Assistance: 7: Independent Dynamic Standing  Balance Dynamic Standing - Balance Support: Left upper extremity supported Dynamic Standing - Level of Assistance: 6: Modified independent (Device/Increase time) Extremity/Trunk Assessment RUE Assessment RUE Assessment: Within Functional Limits LUE Assessment LUE Assessment: Within Functional Limits   See Function  Navigator for Current Functional Status.  [place] 12/11/2016, 10:22 AM
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
## 2016-12-11 NOTE — Progress Notes (Signed)
Speech Language Pathology Discharge Summary  Patient Details  Name: Jerry Zimmerman MRN: [id number] Date of Birth: March 31, 1941  Patient has met 3 of 3 long term goals.  Patient to discharge at overall Supervision level.   Reasons goals not met: N/A   Clinical Impression/Discharge Summary: Patient has made excellent gains and has met 3 of 3 LTG's this admission. Currently, patient demonstrates behaviors consistent with a [place] Level VIII and requires overall supervision cues to complete mildly complex tasks safely in regards to problem solving, recall and awareness. Patient and family education is complete and patient will discharge home with intermittent supervision from family. Patient would benefit from f/u SLP services to maximize his cognitive function and overall functional independence in order to reduce caregiver burden.    Care Partner:  Caregiver Able to Provide Assistance: Yes  Type of Caregiver Assistance: Physical;Cognitive  Recommendation:  Home Health SLP (intermittent supervision)  Rationale for SLP Follow Up: Reduce caregiver burden;Maximize cognitive function and independence   Equipment: N/A   Reasons for discharge: Treatment goals met;Discharged from hospital   Patient/Family Agrees with Progress Made and Goals Achieved: Yes       [place], [place] 12/11/2016, 2:52 PM

## 2016-12-11 NOTE — Discharge Instructions (Signed)
Inpatient Rehab Discharge Instructions  Raye Slyter Discharge date and time: No discharge date for patient encounter.   Activities/Precautions/ Functional Status: Activity: activity as tolerated Diet: diabetic diet Wound Care: keep wound clean and dry Functional status:  ___ No restrictions     ___ Walk up steps independently ___ 24/7 supervision/assistance   ___ Walk up steps with assistance ___ Intermittent supervision/assistance  ___ Bathe/dress independently ___ Walk with walker     _x__ Bathe/dress with assistance ___ Walk Independently    ___ Shower independently ___ Walk with assistance    ___ Shower with assistance ___ No alcohol     ___ Return to work/school ________    COMMUNITY REFERRALS UPON DISCHARGE:    Home Health:   PT     OT                     Agency:  [facility] Phone: [phone]   Medical Equipment/Items Ordered: rolling walker, 3n1 commode                                                     Agency/Supplier:  [facility] @ [phone]      Special Instructions: Follow-up CT of the chest 6-12 months for incidental findings of right middle lobe pulmonary nodule   My questions have been answered and I understand these instructions. I will adhere to these goals and the provided educational materials after my discharge from the hospital.  Patient/Caregiver Signature _______________________________ Date __________  Clinician Signature _______________________________________ Date __________  Please bring this form and your medication list with you to all your follow-up doctor's appointments.

## 2016-12-11 NOTE — Progress Notes (Signed)
Physical Therapy Discharge Summary  Patient Details  Name: Jerry Zimmerman MRN: [id number] Date of Birth: 18-Sep-1940  Today's Date: 12/11/2016   Patient has met 7 of 8 long term goals due to improved activity tolerance, improved balance, improved postural control, increased strength, decreased pain, ability to compensate for deficits, improved attention and improved awareness.  Patient to discharge at an ambulatory level Modified Independent with RW.   Reasons goals not met: Pt did not meet car transfer goal as he continues to require cuing for safe management of RW & overall technique.   Recommendation:  Patient will benefit from ongoing skilled PT services in home health setting to continue to advance safe functional mobility, address ongoing impairments in decreased balance, progress gait training with LRAD, awareness, strengthening & endurance training, and minimize fall risk.  Equipment: RW  Reasons for discharge: treatment goals met  Patient/family agrees with progress made and goals achieved: Yes  PT Discharge Precautions/Restrictions Precautions Precautions: Fall Restrictions Weight Bearing Restrictions: Yes RLE Weight Bearing: Weight bearing as tolerated   Vision/Perception  Vision - History Baseline Vision: Wears glasses only for reading Patient Visual Report: No change from baseline   Cognition Overall Cognitive Status: Impaired/Different from baseline Arousal/Alertness: Awake/alert Orientation Level: Oriented X4 Safety/Judgment: Appears intact   Mobility Bed Mobility Bed Mobility: Rolling Left;Rolling Right;Supine to Sit;Sit to Supine Rolling Right: 6: Modified independent (Device/Increase time) Rolling Left: 6: Modified independent (Device/Increase time) Supine to Sit: 6: Modified independent (Device/Increase time) Sit to Supine: 6: Modified independent (Device/Increase time) Transfers Transfers: Yes Sit to Stand: 6: Modified independent (Device/Increase  time) Stand to Sit: 6: Modified independent (Device/Increase time)  Locomotion  Ambulation Ambulation: Yes Ambulation/Gait Assistance: 6: Modified independent (Device/Increase time) Ambulation Distance (Feet): 200 Feet Assistive device: Rolling walker Gait Gait: Yes Gait Pattern: Decreased step length - right;Decreased step length - left;Decreased stride length (decreased gait speed) Stairs / Additional Locomotion Stairs: Yes Stairs Assistance: 6: Modified independent (Device/Increase time) Stair Management Technique: One rail Right;Sideways Number of Stairs: 16 Height of Stairs: 6 (inches) Ramp: 6: Modified independent (Device) (with RW) Wheelchair Mobility Wheelchair Mobility: No   Balance Balance Balance Assessed: Yes Standardized Balance Assessment Standardized Balance Assessment: Berg Balance Test;Timed Up and Go Test Berg Balance Test Sit to Stand: Able to stand without using hands and stabilize independently Standing Unsupported: Able to stand 2 minutes with supervision Sitting with Back Unsupported but Feet Supported on Floor or Stool: Able to sit safely and securely 2 minutes Stand to Sit: Sits safely with minimal use of hands Transfers: Able to transfer safely, definite need of hands Standing Unsupported with Eyes Closed: Able to stand 10 seconds with supervision Standing Ubsupported with Feet Together: Able to place feet together independently and stand for 1 minute with supervision From Standing, Reach Forward with Outstretched Arm: Can reach confidently >25 cm (10") From Standing Position, Pick up Object from Floor: Able to pick up shoe, needs supervision From Standing Position, Turn to Look Behind Over each Shoulder: Looks behind from both sides and weight shifts well Turn 360 Degrees: Able to turn 360 degrees safely but slowly Standing Unsupported, Alternately Place Feet on Step/Stool: Needs assistance to keep from falling or unable to try (unable to complete 2/2  RLE pain) Standing Unsupported, One Foot in Front: Able to take small step independently and hold 30 seconds Standing on One Leg: Tries to lift leg/unable to hold 3 seconds but remains standing independently Total Score: 40  Timed Up and Go Test TUG: Normal TUG Normal  TUG (seconds): 32 (with RW)  Extremity Assessment  RLE Assessment RLE Assessment:  (ROM WFL, general weakness) LLE Assessment LLE Assessment: Within Functional Limits   See Function Navigator for Current Functional Status.  Waunita Schooner 12/11/2016, 5:02 PM

## 2016-12-11 NOTE — Progress Notes (Signed)
Physical Therapy Session Note  Patient Details  Name: Jerry Zimmerman MRN: [id number] Date of Birth: February 03, 1941  Today's Date: 12/11/2016 PT Individual Time: 1191-4782 and 1307-1400 and 9562-1308 PT Individual Time Calculation (min): 59 min and 53 min and 45 min  Short Term Goals: Week 2:  PT Short Term Goal 1 (Week 2): STG = LTG due to estimated d/c date.  Skilled Therapeutic Interventions/Progress Updates:  Treatment 1: Pt received in bed & agreeable to tx. No c/o pain reported. Pt completed all grad day activities with supervision/mod I overall. On this date pt requires cuing for safety to manage RW when performing car transfer from either sedan or SUV simulated height. Educated pt on need to use RW at all times upon d/c. Pt completed TUG in 32 seconds with RW.  At end of session pt left in recliner in room with all needs within reach & chair alarm set.   Treatment 2: Pt received in recliner & agreeable to tx; no c/o pain reported. Pt ambulated room>outside>gym>room, negotiating various surfaces including tile, brick, carpeted areas, and uneven elevator thresholds with RW and mod I. Pt returned to gym with max cuing for pathfinding, then engaged in pipe tree assembly. Pt required moderate cuing for problem solving & error correction to assemble 2 moderately complex shapes. At end of session pt left sitting on EOB in room with all needs within reach.   Treatment 3: Pt received in bed & agreeable to tx. Pt without c/o pain during session. Pt ambulated room<>gym with RW & mod I. Pt engaged in bowling while static standing for ~12 minutes without BUE support with supervision. Pt required max instructional cuing throughout entire game for proper use of Wii remote. Pt then utilized nu-step with BLE only on level 5 x 10 minutes with activity focusing on BLE strengthening & endurance training. At end of session pt left sitting on EOB with all needs within reach.   Therapy Documentation Precautions:   Precautions Precautions: Fall Precaution Comments: R SAH Restrictions Weight Bearing Restrictions: Yes RLE Weight Bearing: Weight bearing as tolerated Other Position/Activity Restrictions: 2/2 IM nail 11/17/16  Balance: Timed Up and Go Test TUG: Normal TUG Normal TUG (seconds): 32 (with RW)     See Function Navigator for Current Functional Status.   Therapy/Group: Individual Therapy  Sandi Mariscal 12/11/2016, 5:02 PM

## 2016-12-12 LAB — GLUCOSE, CAPILLARY
GLUCOSE-CAPILLARY: 107 mg/dL — AB (ref 65–99)
GLUCOSE-CAPILLARY: 140 mg/dL — AB (ref 65–99)

## 2016-12-12 MED ORDER — METHOCARBAMOL 500 MG PO TABS: 500.0000 mg | ORAL_TABLET | Freq: Four times a day (QID) | ORAL | 0 refills | Status: DC | PRN

## 2016-12-12 NOTE — Progress Notes (Signed)
Social Work  Discharge Note  The overall goal for the admission was met for:   Discharge location: Yes - home with wife and sons  Length of Stay: Yes - 15 days  Discharge activity level: Yes - modified independent overall but some supervision  Home/community participation: Yes  Services provided included: MD, RD, PT, OT, SLP, RN, TR, Pharmacy, [place]: Medicare and Private Insurance: [facility]  Follow-up services arranged: Home Health: PT, OT via [place], DME: rolling walker and 3n1 commode via [place] and Patient/Family has no preference for HH/DME agencies  Comments (or additional information):  Patient/Family verbalized understanding of follow-up arrangements: Yes  Individual responsible for coordination of the follow-up plan: pt  Confirmed correct DME delivered: Annalyssa Thune 12/12/2016    Makennah Omura
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
## 2016-12-12 NOTE — Progress Notes (Signed)
Pt. And family got d/c instructions, follow up apoiments and equipments.Pt. Ready to go home with family.
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
## 2016-12-17 ENCOUNTER — Inpatient Hospital Stay: Payer: 59 | Admitting: Physician Assistant

## 2016-12-26 ENCOUNTER — Telehealth: Payer: Self-pay

## 2016-12-26 ENCOUNTER — Telehealth: Payer: Self-pay | Admitting: Physician Assistant

## 2016-12-26 ENCOUNTER — Ambulatory Visit (INDEPENDENT_AMBULATORY_CARE_PROVIDER_SITE_OTHER): Payer: 59

## 2016-12-26 ENCOUNTER — Encounter: Payer: Self-pay | Admitting: Physician Assistant

## 2016-12-26 ENCOUNTER — Ambulatory Visit (INDEPENDENT_AMBULATORY_CARE_PROVIDER_SITE_OTHER): Payer: 59 | Admitting: Physician Assistant

## 2016-12-26 VITALS — BP 121/76 | HR 113 | Temp 98.0°F | Resp 16 | Ht 65.0 in | Wt 170.0 lb

## 2016-12-26 DIAGNOSIS — I609 Nontraumatic subarachnoid hemorrhage, unspecified: Secondary | ICD-10-CM | POA: Diagnosis not present

## 2016-12-26 DIAGNOSIS — R10814 Left lower quadrant abdominal tenderness: Secondary | ICD-10-CM

## 2016-12-26 DIAGNOSIS — Z09 Encounter for follow-up examination after completed treatment for conditions other than malignant neoplasm: Secondary | ICD-10-CM | POA: Diagnosis not present

## 2016-12-26 DIAGNOSIS — S06302S Unspecified focal traumatic brain injury with loss of consciousness of 31 minutes to 59 minutes, sequela: Secondary | ICD-10-CM | POA: Diagnosis not present

## 2016-12-26 DIAGNOSIS — R0682 Tachypnea, not elsewhere classified: Secondary | ICD-10-CM | POA: Diagnosis not present

## 2016-12-26 DIAGNOSIS — R000 Tachycardia, unspecified: Secondary | ICD-10-CM | POA: Diagnosis not present

## 2016-12-26 DIAGNOSIS — F09 Unspecified mental disorder due to known physiological condition: Secondary | ICD-10-CM

## 2016-12-26 DIAGNOSIS — E113291 Type 2 diabetes mellitus with mild nonproliferative diabetic retinopathy without macular edema, right eye: Secondary | ICD-10-CM

## 2016-12-26 DIAGNOSIS — S72301D Unspecified fracture of shaft of right femur, subsequent encounter for closed fracture with routine healing: Secondary | ICD-10-CM | POA: Diagnosis not present

## 2016-12-26 DIAGNOSIS — R350 Frequency of micturition: Secondary | ICD-10-CM | POA: Diagnosis not present

## 2016-12-26 LAB — POCT CBC
Granulocyte percent: 49.9 %G (ref 37–80)
HCT, POC: 35.5 % — AB (ref 43.5–53.7)
HEMOGLOBIN: 12.2 g/dL — AB (ref 14.1–18.1)
LYMPH, POC: 1.4 (ref 0.6–3.4)
MCH, POC: 32.1 pg — AB (ref 27–31.2)
MCHC: 34.3 g/dL (ref 31.8–35.4)
MCV: 93.4 fL (ref 80–97)
MID (cbc): 0.3 (ref 0–0.9)
MPV: 6.9 fL (ref 0–99.8)
PLATELET COUNT, POC: 307 10*3/uL (ref 142–424)
POC GRANULOCYTE: 1.6 — AB (ref 2–6.9)
POC LYMPH PERCENT: 41.9 %L (ref 10–50)
POC MID %: 8.2 %M (ref 0–12)
RBC: 3.8 M/uL — AB (ref 4.69–6.13)
RDW, POC: 14.8 %
WBC: 3.3 10*3/uL — AB (ref 4.6–10.2)

## 2016-12-26 LAB — POCT URINALYSIS DIP (MANUAL ENTRY)
Bilirubin, UA: NEGATIVE
Blood, UA: NEGATIVE
Glucose, UA: NEGATIVE mg/dL
Ketones, POC UA: NEGATIVE mg/dL
LEUKOCYTES UA: NEGATIVE
NITRITE UA: NEGATIVE
PH UA: 5.5 (ref 5.0–8.0)
Spec Grav, UA: 1.015 (ref 1.010–1.025)
Urobilinogen, UA: 0.2 E.U./dL

## 2016-12-26 LAB — POC MICROSCOPIC URINALYSIS (UMFC): Mucus: ABSENT

## 2016-12-26 LAB — GLUCOSE, POCT (MANUAL RESULT ENTRY): POC Glucose: 199 mg/dl — AB (ref 70–99)

## 2016-12-26 MED ORDER — METFORMIN HCL 500 MG PO TABS: 500.0000 mg | ORAL_TABLET | Freq: Two times a day (BID) | ORAL | 1 refills | Status: DC
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
## 2016-12-26 NOTE — Telephone Encounter (Signed)
LM that radiographs and labs are reassuring. Urinary frequency and abdominal tenderness may be due to constipation. Encouraged him to take the Colace. Follow-up with me in 2 weeks, sooner if needed.

## 2016-12-26 NOTE — Telephone Encounter (Signed)
Jerry HumphreyShanda PT [facility] [phone]) called requesting verbal orders for 2xwk X 1wk then 1xwk X 2wk, called and approved orders.   Stated also that the patient has refused HH OT

## 2016-12-26 NOTE — Assessment & Plan Note (Signed)
COntinued pain, likely expected. Follow-up with Dr. Eulah PontMurphy this afternoon as planned.

## 2016-12-26 NOTE — Assessment & Plan Note (Signed)
Glucose today is elevated, an he is fasting by report. INCREASE metformin to 500 mg BID.

## 2016-12-26 NOTE — Assessment & Plan Note (Signed)
Follow-up with Dr. Riley KillSwartz, as planned. May need follow-up with neurology.

## 2016-12-26 NOTE — Telephone Encounter (Deleted)
Jerry HumphreyShanda is also calling with a drug interaction alert on pr

## 2016-12-26 NOTE — Assessment & Plan Note (Signed)
Follow-up with Dr. Riley KillSwartz as planned. May require additional evaluation with neurology, psychiatry.

## 2016-12-26 NOTE — Telephone Encounter (Signed)
Pt home health nurse is calling to state that there is a drug interaction with pravastation and gemfibrozil  Best number [phone]

## 2016-12-26 NOTE — Telephone Encounter (Signed)
Ok proceed.  

## 2016-12-26 NOTE — Patient Instructions (Addendum)
1. INCREASE the metformin to 500 mg twice each day. 2. Ask Dr. Eulah PontMurphy and Dr. Riley KillSwartz to send me their notes so that we can arrange follow-up. 3. Please RESUME the pravastatin. 4. CONTINUE the other medications as before.    IF you received an x-ray today, you will receive an invoice from [facility]. Please contact [facility] at [phone] with questions or concerns regarding your invoice.   IF you received labwork today, you will receive an invoice from [facility]. Please contact [facility] at [phone] with questions or concerns regarding your invoice.   Our billing staff will not be able to assist you with questions regarding bills from these companies.  You will be contacted with the lab results as soon as they are available. The fastest way to get your results is to activate your My Chart account. Instructions are located on the last page of this paperwork. If you have not heard from us regarding the results in 2 weeks, please contact this office.

## 2016-12-27 NOTE — Telephone Encounter (Signed)
Ah yes.  Cancel pravastatin. Continue gemfibrozil.

## 2016-12-27 NOTE — Telephone Encounter (Signed)
Home health nurse advise

## 2016-12-27 NOTE — Telephone Encounter (Signed)
Are you aware? concerned?

## 2017-01-01 ENCOUNTER — Ambulatory Visit: Payer: 59 | Admitting: Physician Assistant

## 2017-01-11 ENCOUNTER — Ambulatory Visit (INDEPENDENT_AMBULATORY_CARE_PROVIDER_SITE_OTHER): Payer: 59 | Admitting: Physician Assistant

## 2017-01-11 ENCOUNTER — Encounter: Payer: Self-pay | Admitting: Physician Assistant

## 2017-01-11 VITALS — BP 130/72 | HR 98 | Temp 97.6°F | Resp 16 | Ht 65.0 in | Wt 161.4 lb

## 2017-01-11 DIAGNOSIS — I609 Nontraumatic subarachnoid hemorrhage, unspecified: Secondary | ICD-10-CM

## 2017-01-11 DIAGNOSIS — M25561 Pain in right knee: Secondary | ICD-10-CM | POA: Diagnosis not present

## 2017-01-11 DIAGNOSIS — M25562 Pain in left knee: Secondary | ICD-10-CM | POA: Diagnosis not present

## 2017-01-11 DIAGNOSIS — E113291 Type 2 diabetes mellitus with mild nonproliferative diabetic retinopathy without macular edema, right eye: Secondary | ICD-10-CM | POA: Diagnosis not present

## 2017-01-11 DIAGNOSIS — K5903 Drug induced constipation: Secondary | ICD-10-CM

## 2017-01-11 DIAGNOSIS — E785 Hyperlipidemia, unspecified: Secondary | ICD-10-CM

## 2017-01-11 DIAGNOSIS — I10 Essential (primary) hypertension: Secondary | ICD-10-CM

## 2017-01-11 DIAGNOSIS — R000 Tachycardia, unspecified: Secondary | ICD-10-CM

## 2017-01-11 DIAGNOSIS — N400 Enlarged prostate without lower urinary tract symptoms: Secondary | ICD-10-CM | POA: Diagnosis not present

## 2017-01-11 MED ORDER — TRAMADOL HCL 50 MG PO TABS: 50.0000 mg | ORAL_TABLET | Freq: Four times a day (QID) | ORAL | 0 refills | Status: DC | PRN

## 2017-01-11 MED ORDER — PRAVASTATIN SODIUM 20 MG PO TABS: 20.0000 mg | ORAL_TABLET | Freq: Every day | ORAL | 3 refills | Status: DC

## 2017-01-11 MED ORDER — DOCUSATE SODIUM 100 MG PO CAPS: 100.0000 mg | ORAL_CAPSULE | Freq: Two times a day (BID) | ORAL | 3 refills | Status: DC

## 2017-01-11 MED ORDER — LEVETIRACETAM 500 MG PO TABS: 500.0000 mg | ORAL_TABLET | Freq: Two times a day (BID) | ORAL | 1 refills | Status: DC

## 2017-01-11 MED ORDER — GEMFIBROZIL 600 MG PO TABS: ORAL_TABLET | ORAL | 3 refills | Status: DC

## 2017-01-11 MED ORDER — LISINOPRIL 10 MG PO TABS: 10.0000 mg | ORAL_TABLET | Freq: Every day | ORAL | 3 refills | Status: DC

## 2017-01-11 MED ORDER — CLONAZEPAM 0.5 MG PO TABS: 0.5000 mg | ORAL_TABLET | Freq: Two times a day (BID) | ORAL | 0 refills | Status: DC | PRN

## 2017-01-11 MED ORDER — TAMSULOSIN HCL 0.4 MG PO CAPS: 0.4000 mg | ORAL_CAPSULE | Freq: Every day | ORAL | 3 refills | Status: DC

## 2017-01-11 MED ORDER — METHOCARBAMOL 500 MG PO TABS: 500.0000 mg | ORAL_TABLET | Freq: Four times a day (QID) | ORAL | 1 refills | Status: DC | PRN

## 2017-01-11 NOTE — Patient Instructions (Signed)
     IF you received an x-ray today, you will receive an invoice from [facility]. Please contact [facility] at [phone] with questions or concerns regarding your invoice.   IF you received labwork today, you will receive an invoice from [facility]. Please contact [facility] at [phone] with questions or concerns regarding your invoice.   Our billing staff will not be able to assist you with questions regarding bills from these companies.  You will be contacted with the lab results as soon as they are available. The fastest way to get your results is to activate your My Chart account. Instructions are located on the last page of this paperwork. If you have not heard from us regarding the results in 2 weeks, please contact this office.     
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
## 2017-01-14 ENCOUNTER — Encounter: Payer: 59 | Attending: Physical Medicine & Rehabilitation | Admitting: Physical Medicine & Rehabilitation

## 2017-01-14 NOTE — Assessment & Plan Note (Signed)
Continue follow-up with Dr. Riley KillSwartz, as planned.

## 2017-01-14 NOTE — Assessment & Plan Note (Signed)
Stable. Controlled. Continue current treatment. 

## 2017-01-14 NOTE — Assessment & Plan Note (Signed)
Not time for labs. Continue current treatment.
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
## 2017-01-14 NOTE — Assessment & Plan Note (Signed)
Controlled. Continue current treatment. 

## 2017-01-21 DIAGNOSIS — Z79891 Long term (current) use of opiate analgesic: Secondary | ICD-10-CM | POA: Diagnosis not present

## 2017-01-21 DIAGNOSIS — E119 Type 2 diabetes mellitus without complications: Secondary | ICD-10-CM | POA: Diagnosis not present

## 2017-01-21 DIAGNOSIS — S72001D Fracture of unspecified part of neck of right femur, subsequent encounter for closed fracture with routine healing: Secondary | ICD-10-CM | POA: Diagnosis not present

## 2017-01-21 DIAGNOSIS — R339 Retention of urine, unspecified: Secondary | ICD-10-CM | POA: Diagnosis not present

## 2017-01-21 DIAGNOSIS — Z7984 Long term (current) use of oral hypoglycemic drugs: Secondary | ICD-10-CM | POA: Diagnosis not present

## 2017-01-21 DIAGNOSIS — E785 Hyperlipidemia, unspecified: Secondary | ICD-10-CM | POA: Diagnosis not present

## 2017-01-21 DIAGNOSIS — I10 Essential (primary) hypertension: Secondary | ICD-10-CM | POA: Diagnosis not present

## 2017-01-25 ENCOUNTER — Ambulatory Visit (INDEPENDENT_AMBULATORY_CARE_PROVIDER_SITE_OTHER): Payer: 59 | Admitting: Physician Assistant

## 2017-01-25 ENCOUNTER — Encounter: Payer: Self-pay | Admitting: Physician Assistant

## 2017-01-25 ENCOUNTER — Ambulatory Visit (INDEPENDENT_AMBULATORY_CARE_PROVIDER_SITE_OTHER): Payer: 59

## 2017-01-25 VITALS — BP 128/68 | HR 93 | Temp 98.0°F | Resp 18 | Ht 63.39 in | Wt 167.0 lb

## 2017-01-25 DIAGNOSIS — M25551 Pain in right hip: Secondary | ICD-10-CM

## 2017-01-25 DIAGNOSIS — M79604 Pain in right leg: Secondary | ICD-10-CM | POA: Diagnosis not present

## 2017-01-25 DIAGNOSIS — S72351S Displaced comminuted fracture of shaft of right femur, sequela: Secondary | ICD-10-CM | POA: Diagnosis not present

## 2017-01-25 DIAGNOSIS — W19XXXA Unspecified fall, initial encounter: Secondary | ICD-10-CM

## 2017-01-25 MED ORDER — VITAMIN D (ERGOCALCIFEROL) 1.25 MG (50000 UNIT) PO CAPS: 50000.0000 [IU] | ORAL_CAPSULE | ORAL | 0 refills | Status: DC

## 2017-01-25 NOTE — Patient Instructions (Addendum)
Use the robaxin that you have for the muscle spasm - ice to the neck to help with the pain Ultram is the pain medication that you have at home - please use this for your pain  I have added Vit D that you will take weekly to help with healing of the bone Also use calcium 500mg  2x/day to help with healing  Please drink a lot of water
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
## 2017-01-30 ENCOUNTER — Ambulatory Visit: Payer: 59 | Admitting: Family Medicine

## 2017-02-12 ENCOUNTER — Encounter: Payer: Self-pay | Admitting: Physician Assistant

## 2017-02-12 ENCOUNTER — Ambulatory Visit (INDEPENDENT_AMBULATORY_CARE_PROVIDER_SITE_OTHER): Payer: 59 | Admitting: Physician Assistant

## 2017-02-12 VITALS — BP 128/75 | HR 88 | Temp 97.5°F | Resp 16 | Ht 63.39 in | Wt 165.0 lb

## 2017-02-12 DIAGNOSIS — I609 Nontraumatic subarachnoid hemorrhage, unspecified: Secondary | ICD-10-CM

## 2017-02-12 DIAGNOSIS — S06302S Unspecified focal traumatic brain injury with loss of consciousness of 31 minutes to 59 minutes, sequela: Secondary | ICD-10-CM

## 2017-02-12 DIAGNOSIS — I10 Essential (primary) hypertension: Secondary | ICD-10-CM | POA: Diagnosis not present

## 2017-02-12 DIAGNOSIS — R42 Dizziness and giddiness: Secondary | ICD-10-CM

## 2017-02-12 DIAGNOSIS — S72351S Displaced comminuted fracture of shaft of right femur, sequela: Secondary | ICD-10-CM | POA: Diagnosis not present

## 2017-02-12 DIAGNOSIS — M25561 Pain in right knee: Secondary | ICD-10-CM

## 2017-02-12 DIAGNOSIS — K5903 Drug induced constipation: Secondary | ICD-10-CM | POA: Diagnosis not present

## 2017-02-12 DIAGNOSIS — E113291 Type 2 diabetes mellitus with mild nonproliferative diabetic retinopathy without macular edema, right eye: Secondary | ICD-10-CM

## 2017-02-12 DIAGNOSIS — M25562 Pain in left knee: Secondary | ICD-10-CM

## 2017-02-12 DIAGNOSIS — E785 Hyperlipidemia, unspecified: Secondary | ICD-10-CM

## 2017-02-12 DIAGNOSIS — N400 Enlarged prostate without lower urinary tract symptoms: Secondary | ICD-10-CM | POA: Diagnosis not present

## 2017-02-12 MED ORDER — METHOCARBAMOL 500 MG PO TABS: 500.0000 mg | ORAL_TABLET | Freq: Four times a day (QID) | ORAL | 3 refills | Status: DC | PRN

## 2017-02-12 MED ORDER — DOCUSATE SODIUM 100 MG PO CAPS: 100.0000 mg | ORAL_CAPSULE | Freq: Two times a day (BID) | ORAL | 3 refills | Status: DC

## 2017-02-12 MED ORDER — VITAMIN D (ERGOCALCIFEROL) 1.25 MG (50000 UNIT) PO CAPS: 50000.0000 [IU] | ORAL_CAPSULE | ORAL | 0 refills | Status: DC

## 2017-02-12 MED ORDER — LISINOPRIL 10 MG PO TABS: 10.0000 mg | ORAL_TABLET | Freq: Every day | ORAL | 3 refills | Status: DC

## 2017-02-12 MED ORDER — TRAMADOL HCL 50 MG PO TABS: 50.0000 mg | ORAL_TABLET | Freq: Four times a day (QID) | ORAL | 0 refills | Status: DC | PRN

## 2017-02-12 MED ORDER — TAMSULOSIN HCL 0.4 MG PO CAPS: 0.4000 mg | ORAL_CAPSULE | Freq: Every day | ORAL | 3 refills | Status: DC

## 2017-02-12 MED ORDER — METFORMIN HCL 500 MG PO TABS: 500.0000 mg | ORAL_TABLET | Freq: Two times a day (BID) | ORAL | 3 refills | Status: DC

## 2017-02-12 MED ORDER — LEVETIRACETAM 500 MG PO TABS: 500.0000 mg | ORAL_TABLET | Freq: Two times a day (BID) | ORAL | 3 refills | Status: DC

## 2017-02-12 MED ORDER — GEMFIBROZIL 600 MG PO TABS: ORAL_TABLET | ORAL | 3 refills | Status: DC

## 2017-02-12 MED ORDER — PRAVASTATIN SODIUM 20 MG PO TABS: 20.0000 mg | ORAL_TABLET | Freq: Every day | ORAL | 3 refills | Status: DC

## 2017-02-12 MED ORDER — DICLOFENAC SODIUM 1 % TD GEL: 2.0000 g | Freq: Four times a day (QID) | TRANSDERMAL | 3 refills | Status: DC
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
## 2017-02-12 NOTE — Assessment & Plan Note (Signed)
Continue tamsulosin.
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
## 2017-02-12 NOTE — Assessment & Plan Note (Signed)
Await labs. Adjust regimen as indicated by results.  

## 2017-02-12 NOTE — Assessment & Plan Note (Signed)
COntinue follow-up per Dr. Riley KillSwartz. Cognitive improvements noted.

## 2017-02-12 NOTE — Assessment & Plan Note (Signed)
Continue Keppra for seizure prophylaxis.

## 2017-02-12 NOTE — Assessment & Plan Note (Signed)
Controlled. No changes. 

## 2017-02-12 NOTE — Patient Instructions (Addendum)
TAKE THESE MEDICINES EVERY DAY: 1. Aspirin  2. Gemfibrozil (Lopid)- for diabetes 3. Levetriacetam (Keppra)- to prevent seizure because of brain bleed 4. Pravastatin- for cholesterol 5. Lisinopril (Prinvitil, Zestril)- for blood pressure 6.Metformin (Glucophage)- for diabetes 7. Docusate (Colace)- hard stool/ constipation  Once a week: 8. Vitamin D (Drisdrol)- to help fracture heal  As needed: 9. Diclofenac (Voltaren) gel- for knee pain 10. Methocarbamol- muscle spasms 11. Tamsulosin (Flomax)- to help urine  12. Tramadol (Ultram)- for pain  Stand up slowly. Be careful when changing position. Don't rush.  IF you received an x-ray today, you will receive an invoice from [facility]. Please contact [facility] at [phone] with questions or concerns regarding your invoice.   IF you received labwork today, you will receive an invoice from [facility]. Please contact [facility] at [phone] with questions or concerns regarding your invoice.   Our billing staff will not be able to assist you with questions regarding bills from these companies.  You will be contacted with the lab results as soon as they are available. The fastest way to get your results is to activate your My Chart account. Instructions are located on the last page of this paperwork. If you have not heard from us regarding the results in 2 weeks, please contact this office.

## 2017-02-13 ENCOUNTER — Encounter: Payer: Self-pay | Admitting: Physician Assistant

## 2017-02-13 LAB — CBC WITH DIFFERENTIAL/PLATELET
Basophils Absolute: 0 10*3/uL (ref 0.0–0.2)
Basos: 1 %
EOS (ABSOLUTE): 0.3 10*3/uL (ref 0.0–0.4)
Eos: 9 %
Hematocrit: 39.4 % (ref 37.5–51.0)
Hemoglobin: 12.9 g/dL — ABNORMAL LOW (ref 13.0–17.7)
IMMATURE GRANS (ABS): 0 10*3/uL (ref 0.0–0.1)
IMMATURE GRANULOCYTES: 0 %
LYMPHS: 30 %
Lymphocytes Absolute: 1 10*3/uL (ref 0.7–3.1)
MCH: 30.7 pg (ref 26.6–33.0)
MCHC: 32.7 g/dL (ref 31.5–35.7)
MCV: 94 fL (ref 79–97)
MONOS ABS: 0.4 10*3/uL (ref 0.1–0.9)
Monocytes: 11 %
Neutrophils Absolute: 1.7 10*3/uL (ref 1.4–7.0)
Neutrophils: 49 %
PLATELETS: 347 10*3/uL (ref 150–379)
RBC: 4.2 x10E6/uL (ref 4.14–5.80)
RDW: 13.3 % (ref 12.3–15.4)
WBC: 3.4 10*3/uL (ref 3.4–10.8)

## 2017-02-13 LAB — COMPREHENSIVE METABOLIC PANEL
A/G RATIO: 1.2 (ref 1.2–2.2)
ALT: 8 IU/L (ref 0–44)
AST: 16 IU/L (ref 0–40)
Albumin: 4.2 g/dL (ref 3.5–4.8)
Alkaline Phosphatase: 133 IU/L — ABNORMAL HIGH (ref 39–117)
BUN/Creatinine Ratio: 15 (ref 10–24)
BUN: 14 mg/dL (ref 8–27)
Bilirubin Total: 0.4 mg/dL (ref 0.0–1.2)
CALCIUM: 10.2 mg/dL (ref 8.6–10.2)
CO2: 24 mmol/L (ref 20–29)
CREATININE: 0.92 mg/dL (ref 0.76–1.27)
Chloride: 102 mmol/L (ref 96–106)
GFR calc Af Amer: 93 mL/min/{1.73_m2} (ref >59)
GFR, EST NON AFRICAN AMERICAN: 81 mL/min/{1.73_m2} (ref >59)
GLUCOSE: 152 mg/dL — AB (ref 65–99)
Globulin, Total: 3.5 g/dL (ref 1.5–4.5)
Potassium: 4.8 mmol/L (ref 3.5–5.2)
Sodium: 142 mmol/L (ref 134–144)
TOTAL PROTEIN: 7.7 g/dL (ref 6.0–8.5)

## 2017-02-13 LAB — LIPID PANEL
CHOLESTEROL TOTAL: 141 mg/dL (ref 100–199)
Chol/HDL Ratio: 3.1 ratio (ref 0.0–5.0)
HDL: 46 mg/dL (ref >39)
LDL Calculated: 71 mg/dL (ref 0–99)
TRIGLYCERIDES: 121 mg/dL (ref 0–149)
VLDL CHOLESTEROL CAL: 24 mg/dL (ref 5–40)

## 2017-02-13 LAB — HEMOGLOBIN A1C
ESTIMATED AVERAGE GLUCOSE: 120 mg/dL
Hgb A1c MFr Bld: 5.8 % — ABNORMAL HIGH (ref 4.8–5.6)

## 2017-02-13 LAB — TSH: TSH: 0.679 u[IU]/mL (ref 0.450–4.500)

## 2017-02-13 LAB — T4, FREE: FREE T4: 1.14 ng/dL (ref 0.82–1.77)

## 2017-03-11 DIAGNOSIS — Z79891 Long term (current) use of opiate analgesic: Secondary | ICD-10-CM | POA: Diagnosis not present

## 2017-03-11 DIAGNOSIS — R339 Retention of urine, unspecified: Secondary | ICD-10-CM | POA: Diagnosis not present

## 2017-03-11 DIAGNOSIS — E785 Hyperlipidemia, unspecified: Secondary | ICD-10-CM | POA: Diagnosis not present

## 2017-03-11 DIAGNOSIS — S72001D Fracture of unspecified part of neck of right femur, subsequent encounter for closed fracture with routine healing: Secondary | ICD-10-CM | POA: Diagnosis not present

## 2017-03-11 DIAGNOSIS — I10 Essential (primary) hypertension: Secondary | ICD-10-CM | POA: Diagnosis not present

## 2017-03-11 DIAGNOSIS — Z7984 Long term (current) use of oral hypoglycemic drugs: Secondary | ICD-10-CM | POA: Diagnosis not present

## 2017-03-11 DIAGNOSIS — E119 Type 2 diabetes mellitus without complications: Secondary | ICD-10-CM | POA: Diagnosis not present

## 2017-04-03 ENCOUNTER — Encounter: Payer: Self-pay | Admitting: Physician Assistant

## 2017-04-03 ENCOUNTER — Ambulatory Visit (INDEPENDENT_AMBULATORY_CARE_PROVIDER_SITE_OTHER): Payer: 59 | Admitting: Physician Assistant

## 2017-04-03 VITALS — BP 135/73 | HR 76 | Temp 97.9°F | Resp 18 | Ht 63.39 in | Wt 169.6 lb

## 2017-04-03 DIAGNOSIS — E113299 Type 2 diabetes mellitus with mild nonproliferative diabetic retinopathy without macular edema, unspecified eye: Secondary | ICD-10-CM | POA: Diagnosis not present

## 2017-04-03 DIAGNOSIS — L309 Dermatitis, unspecified: Secondary | ICD-10-CM

## 2017-04-03 DIAGNOSIS — S72301D Unspecified fracture of shaft of right femur, subsequent encounter for closed fracture with routine healing: Secondary | ICD-10-CM

## 2017-04-03 DIAGNOSIS — H6123 Impacted cerumen, bilateral: Secondary | ICD-10-CM

## 2017-04-03 DIAGNOSIS — R42 Dizziness and giddiness: Secondary | ICD-10-CM

## 2017-04-03 DIAGNOSIS — S06302S Unspecified focal traumatic brain injury with loss of consciousness of 31 minutes to 59 minutes, sequela: Secondary | ICD-10-CM | POA: Diagnosis not present

## 2017-04-03 MED ORDER — TRIAMCINOLONE ACETONIDE 0.1 % EX CREA: 1.0000 "application " | TOPICAL_CREAM | Freq: Two times a day (BID) | CUTANEOUS | 0 refills | Status: DC

## 2017-04-03 NOTE — Patient Instructions (Signed)
     IF you received an x-ray today, you will receive an invoice from [facility]. Please contact [facility] at [phone] with questions or concerns regarding your invoice.   IF you received labwork today, you will receive an invoice from [facility]. Please contact [facility] at [phone] with questions or concerns regarding your invoice.   Our billing staff will not be able to assist you with questions regarding bills from these companies.  You will be contacted with the lab results as soon as they are available. The fastest way to get your results is to activate your My Chart account. Instructions are located on the last page of this paperwork. If you have not heard from us regarding the results in 2 weeks, please contact this office.     
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
## 2017-04-04 NOTE — Assessment & Plan Note (Signed)
Well controlled based on labs last month. No changes made today.

## 2017-04-04 NOTE — Assessment & Plan Note (Signed)
Follow-up with Dr. Eulah Pont next week, as planned. If he will lose his benefits and travel to [place], he will let me know so that I can provide adequate supply prescriptions of his chronic medications.

## 2017-04-04 NOTE — Addendum Note (Signed)
Addendum  created 04/04/17 1242 by Lilyrose Tanney, MD   Sign clinical note    

## 2017-04-04 NOTE — Assessment & Plan Note (Signed)
He appears to be nearly back to baseline from a cognitive standpoint.

## 2017-04-08 ENCOUNTER — Encounter: Payer: Self-pay | Admitting: Physician Assistant

## 2017-04-08 ENCOUNTER — Ambulatory Visit (INDEPENDENT_AMBULATORY_CARE_PROVIDER_SITE_OTHER): Payer: 59 | Admitting: Physician Assistant

## 2017-04-08 VITALS — BP 126/74 | HR 72 | Temp 98.0°F | Resp 18 | Ht 63.39 in | Wt 167.4 lb

## 2017-04-08 DIAGNOSIS — H6122 Impacted cerumen, left ear: Secondary | ICD-10-CM | POA: Diagnosis not present

## 2017-04-08 NOTE — Progress Notes (Signed)
Chief Complaint  Patient presents with  . Cerumen Impaction    left ear, Pt states he doesn't have pain but difficulty hearing.  . Follow-up    History of Present Illness: Patient presents for repeat irrigation of the LEFT ear.  I saw him last week and both ears were full of dry wax. The RIGHT canal was cleared with irrigation, but the LEFT persisted, even with manual debridement. Additional Colace was placed in the canal for softening and he returns today for repeat irrigation.   No Known Allergies  Prior to Admission medications   Medication Sig Start Date End Date Taking? Authorizing Provider  aspirin 81 MG tablet Take 1 tablet (81 mg total) by mouth daily. 07/17/16  Yes Marybel Alcott, PA-C  docusate sodium (COLACE) 100 MG capsule Take 1 capsule (100 mg total) by mouth 2 (two) times daily. AS NEEDED for hard stool 02/12/17  Yes Raunak Antuna, PA-C  gemfibrozil (LOPID) 600 MG tablet TAKE ONE TABLET BY MOUTH TWICE DAILY BEFORE MEAL(S) for high triglycerides (cholesterol) 02/12/17  Yes Mikayah Joy, PA-C  levETIRAcetam (KEPPRA) 500 MG tablet Take 1 tablet (500 mg total) by mouth 2 (two) times daily. To prevent seizures 02/12/17  Yes Viyan Rosamond, PA-C  lisinopril (PRINIVIL,ZESTRIL) 10 MG tablet Take 1 tablet (10 mg total) by mouth daily. For blood pressure 02/12/17  Yes Shy Guallpa, PA-C  metFORMIN (GLUCOPHAGE) 500 MG tablet Take 1 tablet (500 mg total) by mouth 2 (two) times daily with a meal. For diabetes 02/12/17  Yes Fraida Veldman, PA-C  methocarbamol (ROBAXIN) 500 MG tablet Take 1 tablet (500 mg total) by mouth every 6 (six) hours as needed for muscle spasms. 02/12/17  Yes Tremon Sainvil, PA-C  pravastatin (PRAVACHOL) 20 MG tablet Take 1 tablet (20 mg total) by mouth daily. For cholesterol 02/12/17  Yes Claire Bridge, PA-C  tamsulosin (FLOMAX) 0.4 MG CAPS capsule Take 1 capsule (0.4 mg total) by mouth daily after supper. For urine 02/12/17  Yes Breckon Reeves, PA-C    traMADol (ULTRAM) 50 MG tablet Take 1 tablet (50 mg total) by mouth every 6 (six) hours as needed for moderate pain. 02/12/17  Yes Tiyon Sanor, PA-C  triamcinolone cream (KENALOG) 0.1 % Apply 1 application topically 2 (two) times daily. 04/03/17  Yes Kendrell Lottman, PA-C  Vitamin D, Ergocalciferol, (DRISDOL) 50000 units CAPS capsule Take 1 capsule (50,000 Units total) by mouth every 7 (seven) days. For bone healing 02/12/17  Yes Seeley Hissong, PA-C  diclofenac sodium (VOLTAREN) 1 % GEL Apply 2 g topically 4 (four) times daily. Patient not taking: Reported on 04/03/2017 02/12/17   Porfirio Oar, PA-C    Patient Active Problem List   Diagnosis Date Noted  . BPH (benign prostatic hyperplasia) 01/11/2017  . Cognitive disorder   . 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 Closed traumatic nondisplaced fracture of shaft of right femur with routine healing 11/27/2016  . Subarachnoid hemorrhage (HCC) 11/17/2016  . Displaced transverse fracture of shaft of right femur, initial encounter for closed fracture (HCC) 11/17/2016  . Rectal bleeding 02/29/2016  . Neuropathy 02/29/2016  . Diabetic retinopathy (HCC) 07/09/2014  . 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 Essential hypertension, benign   . Obesity   . SLEEP APNEA 05/17/2009  . DM (diabetes mellitus), type 2 with ophthalmic complications (HCC) 05/16/2009  . Hyperlipidemia 05/16/2009     Physical Exam  Constitutional: He is oriented to person,  place, and time. He appears well-developed and well-nourished. He is active and cooperative. No distress.  BP 126/74 (BP Location: Right Arm, Patient Position: Sitting, Cuff Size: Normal)   Pulse 72   Temp 98 F (36.7 C) (Oral)   Resp 18   Ht 5' 3.39" (1.61 m)   Wt 167 lb 6.4 oz (75.9 kg)   SpO2 98%   BMI 29.29 kg/m    HENT:  Cerumen in the LEFT ear persists.  Irrigation and attempts to debride manually did not resolve the  problem.  Eyes: Conjunctivae are normal.  Pulmonary/Chest: Effort normal.  Neurological: He is alert and oriented to person, place, and time.  Psychiatric: He has a normal mood and affect. His speech is normal and behavior is normal.      ASSESSMENT & PLAN:  1. Cerumen debris on tympanic membrane of left ear Second attempt to debride the cerumen from the LEFT ear canal was unsuccessful. Patient's son given the remaining Colace and pipette, with instructions to put drops in daily and return for repeat irrigation later this week. - Ear wax removal    Return in 4 days (on 04/12/2017) for repeat LEFT ear irrigation.   Fernande Bras, PA-C Primary Care at [facility]

## 2017-04-08 NOTE — Patient Instructions (Signed)
     IF you received an x-ray today, you will receive an invoice from [facility]. Please contact [facility] at [phone] with questions or concerns regarding your invoice.   IF you received labwork today, you will receive an invoice from [facility]. Please contact [facility] at [phone] with questions or concerns regarding your invoice.   Our billing staff will not be able to assist you with questions regarding bills from these companies.  You will be contacted with the lab results as soon as they are available. The fastest way to get your results is to activate your My Chart account. Instructions are located on the last page of this paperwork. If you have not heard from us regarding the results in 2 weeks, please contact this office.     

## 2017-04-20 ENCOUNTER — Encounter: Payer: Self-pay | Admitting: Physician Assistant

## 2017-04-20 ENCOUNTER — Ambulatory Visit (INDEPENDENT_AMBULATORY_CARE_PROVIDER_SITE_OTHER): Payer: 59 | Admitting: Physician Assistant

## 2017-04-20 VITALS — BP 111/71 | HR 96 | Temp 97.9°F | Resp 18 | Ht 63.39 in | Wt 170.8 lb

## 2017-04-20 DIAGNOSIS — I609 Nontraumatic subarachnoid hemorrhage, unspecified: Secondary | ICD-10-CM | POA: Diagnosis not present

## 2017-04-20 DIAGNOSIS — K5903 Drug induced constipation: Secondary | ICD-10-CM

## 2017-04-20 DIAGNOSIS — M25562 Pain in left knee: Secondary | ICD-10-CM | POA: Diagnosis not present

## 2017-04-20 DIAGNOSIS — E785 Hyperlipidemia, unspecified: Secondary | ICD-10-CM

## 2017-04-20 DIAGNOSIS — S72351S Displaced comminuted fracture of shaft of right femur, sequela: Secondary | ICD-10-CM | POA: Diagnosis not present

## 2017-04-20 DIAGNOSIS — L309 Dermatitis, unspecified: Secondary | ICD-10-CM

## 2017-04-20 DIAGNOSIS — I10 Essential (primary) hypertension: Secondary | ICD-10-CM

## 2017-04-20 DIAGNOSIS — H6122 Impacted cerumen, left ear: Secondary | ICD-10-CM

## 2017-04-20 DIAGNOSIS — M25561 Pain in right knee: Secondary | ICD-10-CM

## 2017-04-20 DIAGNOSIS — N400 Enlarged prostate without lower urinary tract symptoms: Secondary | ICD-10-CM | POA: Diagnosis not present

## 2017-04-20 DIAGNOSIS — E113291 Type 2 diabetes mellitus with mild nonproliferative diabetic retinopathy without macular edema, right eye: Secondary | ICD-10-CM | POA: Diagnosis not present

## 2017-04-20 MED ORDER — TRIAMCINOLONE ACETONIDE 0.1 % EX CREA: 1.0000 "application " | TOPICAL_CREAM | Freq: Two times a day (BID) | CUTANEOUS | 0 refills | Status: DC

## 2017-04-20 MED ORDER — ASPIRIN 81 MG PO TABS: 81.0000 mg | ORAL_TABLET | Freq: Every day | ORAL | 1 refills | Status: DC

## 2017-04-20 MED ORDER — DICLOFENAC SODIUM 1 % TD GEL: 2.0000 g | Freq: Four times a day (QID) | TRANSDERMAL | 1 refills | Status: DC

## 2017-04-20 MED ORDER — VITAMIN D (ERGOCALCIFEROL) 1.25 MG (50000 UNIT) PO CAPS: 50000.0000 [IU] | ORAL_CAPSULE | ORAL | 1 refills | Status: DC

## 2017-04-20 MED ORDER — TRAMADOL HCL 50 MG PO TABS: 50.0000 mg | ORAL_TABLET | Freq: Four times a day (QID) | ORAL | 1 refills | Status: DC | PRN

## 2017-04-20 MED ORDER — DOCUSATE SODIUM 100 MG PO CAPS: 100.0000 mg | ORAL_CAPSULE | Freq: Two times a day (BID) | ORAL | 1 refills | Status: DC

## 2017-04-20 MED ORDER — METHOCARBAMOL 500 MG PO TABS: 500.0000 mg | ORAL_TABLET | Freq: Four times a day (QID) | ORAL | 1 refills | Status: DC | PRN

## 2017-04-20 MED ORDER — METFORMIN HCL 500 MG PO TABS: 500.0000 mg | ORAL_TABLET | Freq: Two times a day (BID) | ORAL | 1 refills | Status: DC

## 2017-04-20 MED ORDER — GEMFIBROZIL 600 MG PO TABS: ORAL_TABLET | ORAL | 1 refills | Status: DC

## 2017-04-20 MED ORDER — LEVETIRACETAM 500 MG PO TABS
500.0000 mg | ORAL_TABLET | Freq: Two times a day (BID) | ORAL | 1 refills | Status: DC
Start: 2017-04-20 — End: 2017-06-12

## 2017-04-20 MED ORDER — PRAVASTATIN SODIUM 20 MG PO TABS: 20.0000 mg | ORAL_TABLET | Freq: Every day | ORAL | 1 refills | Status: DC

## 2017-04-20 MED ORDER — LISINOPRIL 10 MG PO TABS: 10.0000 mg | ORAL_TABLET | Freq: Every day | ORAL | 1 refills | Status: DC

## 2017-04-20 MED ORDER — TAMSULOSIN HCL 0.4 MG PO CAPS: 0.4000 mg | ORAL_CAPSULE | Freq: Every day | ORAL | 1 refills | Status: DC

## 2017-04-20 NOTE — Patient Instructions (Addendum)
Have a WONDERFUL trip! I'll see you when you get back!    IF you received an x-ray today, you will receive an invoice from [facility]. Please contact [facility] at [phone] with questions or concerns regarding your invoice.   IF you received labwork today, you will receive an invoice from [facility]. Please contact [facility] at [phone] with questions or concerns regarding your invoice.   Our billing staff will not be able to assist you with questions regarding bills from these companies.  You will be contacted with the lab results as soon as they are available. The fastest way to get your results is to activate your My Chart account. Instructions are located on the last page of this paperwork. If you have not heard from us regarding the results in 2 weeks, please contact this office.
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
## 2017-05-14 ENCOUNTER — Ambulatory Visit: Payer: 59 | Admitting: Physician Assistant

## 2017-05-17 NOTE — Telephone Encounter (Signed)
Error

## 2017-05-29 ENCOUNTER — Encounter: Payer: Self-pay | Admitting: Physician Assistant

## 2017-06-12 ENCOUNTER — Ambulatory Visit (INDEPENDENT_AMBULATORY_CARE_PROVIDER_SITE_OTHER): Payer: 59 | Admitting: Physician Assistant

## 2017-06-12 ENCOUNTER — Encounter: Payer: Self-pay | Admitting: Physician Assistant

## 2017-06-12 VITALS — BP 128/86 | HR 100 | Temp 97.8°F | Resp 18 | Ht 63.39 in | Wt 173.2 lb

## 2017-06-12 DIAGNOSIS — G4730 Sleep apnea, unspecified: Secondary | ICD-10-CM | POA: Diagnosis not present

## 2017-06-12 DIAGNOSIS — I10 Essential (primary) hypertension: Secondary | ICD-10-CM

## 2017-06-12 DIAGNOSIS — E785 Hyperlipidemia, unspecified: Secondary | ICD-10-CM | POA: Diagnosis not present

## 2017-06-12 DIAGNOSIS — Z8679 Personal history of other diseases of the circulatory system: Secondary | ICD-10-CM | POA: Diagnosis not present

## 2017-06-12 DIAGNOSIS — F09 Unspecified mental disorder due to known physiological condition: Secondary | ICD-10-CM

## 2017-06-12 DIAGNOSIS — E113291 Type 2 diabetes mellitus with mild nonproliferative diabetic retinopathy without macular edema, right eye: Secondary | ICD-10-CM

## 2017-06-12 DIAGNOSIS — M25561 Pain in right knee: Secondary | ICD-10-CM | POA: Diagnosis not present

## 2017-06-12 DIAGNOSIS — L309 Dermatitis, unspecified: Secondary | ICD-10-CM | POA: Diagnosis not present

## 2017-06-12 DIAGNOSIS — S72351S Displaced comminuted fracture of shaft of right femur, sequela: Secondary | ICD-10-CM

## 2017-06-12 DIAGNOSIS — E113299 Type 2 diabetes mellitus with mild nonproliferative diabetic retinopathy without macular edema, unspecified eye: Secondary | ICD-10-CM | POA: Diagnosis not present

## 2017-06-12 DIAGNOSIS — K5903 Drug induced constipation: Secondary | ICD-10-CM | POA: Diagnosis not present

## 2017-06-12 DIAGNOSIS — Z0000 Encounter for general adult medical examination without abnormal findings: Secondary | ICD-10-CM

## 2017-06-12 DIAGNOSIS — M25562 Pain in left knee: Secondary | ICD-10-CM

## 2017-06-12 DIAGNOSIS — N400 Enlarged prostate without lower urinary tract symptoms: Secondary | ICD-10-CM

## 2017-06-12 DIAGNOSIS — Z87820 Personal history of traumatic brain injury: Secondary | ICD-10-CM

## 2017-06-12 MED ORDER — LEVETIRACETAM 500 MG PO TABS: 500.0000 mg | ORAL_TABLET | Freq: Two times a day (BID) | ORAL | 1 refills | Status: AC

## 2017-06-12 MED ORDER — DICLOFENAC SODIUM 1 % TD GEL: 2.0000 g | Freq: Four times a day (QID) | TRANSDERMAL | 1 refills | Status: AC

## 2017-06-12 MED ORDER — METHOCARBAMOL 500 MG PO TABS: 500.0000 mg | ORAL_TABLET | Freq: Four times a day (QID) | ORAL | 1 refills | Status: AC | PRN

## 2017-06-12 MED ORDER — TAMSULOSIN HCL 0.4 MG PO CAPS: 0.4000 mg | ORAL_CAPSULE | Freq: Every day | ORAL | 1 refills | Status: AC

## 2017-06-12 MED ORDER — LISINOPRIL 10 MG PO TABS
10.0000 mg | ORAL_TABLET | Freq: Every day | ORAL | 1 refills | Status: AC
Start: 2017-06-12 — End: ?

## 2017-06-12 MED ORDER — DOCUSATE SODIUM 100 MG PO CAPS: 100.0000 mg | ORAL_CAPSULE | Freq: Two times a day (BID) | ORAL | 1 refills | Status: AC

## 2017-06-12 MED ORDER — GEMFIBROZIL 600 MG PO TABS: ORAL_TABLET | ORAL | 1 refills | Status: AC

## 2017-06-12 MED ORDER — ALREX 0.2 % OP SUSP: OPHTHALMIC | 99 refills | Status: AC

## 2017-06-12 MED ORDER — ASPIRIN 81 MG PO TABS: 81.0000 mg | ORAL_TABLET | Freq: Every day | ORAL | 1 refills | Status: AC

## 2017-06-12 MED ORDER — TRAMADOL HCL 50 MG PO TABS: 50.0000 mg | ORAL_TABLET | Freq: Four times a day (QID) | ORAL | 0 refills | Status: AC | PRN

## 2017-06-12 MED ORDER — PRAVASTATIN SODIUM 20 MG PO TABS: 20.0000 mg | ORAL_TABLET | Freq: Every day | ORAL | 1 refills | Status: AC

## 2017-06-12 MED ORDER — VITAMIN D (ERGOCALCIFEROL) 1.25 MG (50000 UNIT) PO CAPS: 50000.0000 [IU] | ORAL_CAPSULE | ORAL | 1 refills | Status: AC

## 2017-06-12 MED ORDER — TRIAMCINOLONE ACETONIDE 0.1 % EX CREA: 1.0000 "application " | TOPICAL_CREAM | Freq: Two times a day (BID) | CUTANEOUS | 0 refills | Status: AC

## 2017-06-12 MED ORDER — METFORMIN HCL 500 MG PO TABS: 500.0000 mg | ORAL_TABLET | Freq: Two times a day (BID) | ORAL | 1 refills | Status: AC

## 2017-06-12 NOTE — Patient Instructions (Addendum)
Have a WONDERFUL time with your family in [place]. I look forward to seeing you when you return!  Your long term medications will be: 1. Gemfibrozil (Lopid) for cholesterol 2. Pravastatin (Pravachol) for cholesterol. 3. Lisinopril (Prinivil/Zestril) for blood pressure. 4. Metformin (Glucophage) for diabetes. 5. Aspirin for heart protection. 6. Tamsulosin (Flomax) for urine flow.    IF you received an x-ray today, you will receive an invoice from [facility]. Please contact [facility] at [phone] with questions or concerns regarding your invoice.   IF you received labwork today, you will receive an invoice from [facility]. Please contact [facility] at [phone] with questions or concerns regarding your invoice.   Our billing staff will not be able to assist you with questions regarding bills from these companies.  You will be contacted with the lab results as soon as they are available. The fastest way to get your results is to activate your My Chart account. Instructions are located on the last page of this paperwork. If you have not heard from us regarding the results in 2 weeks, please contact this office.
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
## 2017-06-13 LAB — CBC WITH DIFFERENTIAL/PLATELET
BASOS: 1 %
Basophils Absolute: 0 10*3/uL (ref 0.0–0.2)
EOS (ABSOLUTE): 0.4 10*3/uL (ref 0.0–0.4)
Eos: 9 %
Hematocrit: 42.7 % (ref 37.5–51.0)
Hemoglobin: 14.5 g/dL (ref 13.0–17.7)
IMMATURE GRANS (ABS): 0 10*3/uL (ref 0.0–0.1)
Immature Granulocytes: 0 %
LYMPHS: 39 %
Lymphocytes Absolute: 1.6 10*3/uL (ref 0.7–3.1)
MCH: 31.6 pg (ref 26.6–33.0)
MCHC: 34 g/dL (ref 31.5–35.7)
MCV: 93 fL (ref 79–97)
MONOS ABS: 0.4 10*3/uL (ref 0.1–0.9)
Monocytes: 9 %
NEUTROS ABS: 1.8 10*3/uL (ref 1.4–7.0)
Neutrophils: 42 %
PLATELETS: 306 10*3/uL (ref 150–379)
RBC: 4.59 x10E6/uL (ref 4.14–5.80)
RDW: 13.7 % (ref 12.3–15.4)
WBC: 4.2 10*3/uL (ref 3.4–10.8)

## 2017-06-13 LAB — COMPREHENSIVE METABOLIC PANEL
A/G RATIO: 1.3 (ref 1.2–2.2)
ALT: 14 IU/L (ref 0–44)
AST: 21 IU/L (ref 0–40)
Albumin: 4.4 g/dL (ref 3.5–4.8)
Alkaline Phosphatase: 96 IU/L (ref 39–117)
BILIRUBIN TOTAL: 0.6 mg/dL (ref 0.0–1.2)
BUN/Creatinine Ratio: 11 (ref 10–24)
BUN: 11 mg/dL (ref 8–27)
CHLORIDE: 100 mmol/L (ref 96–106)
CO2: 22 mmol/L (ref 20–29)
Calcium: 9.8 mg/dL (ref 8.6–10.2)
Creatinine, Ser: 1.02 mg/dL (ref 0.76–1.27)
GFR calc non Af Amer: 71 mL/min/{1.73_m2} (ref >59)
GFR, EST AFRICAN AMERICAN: 82 mL/min/{1.73_m2} (ref >59)
GLOBULIN, TOTAL: 3.5 g/dL (ref 1.5–4.5)
Glucose: 114 mg/dL — ABNORMAL HIGH (ref 65–99)
Potassium: 4.3 mmol/L (ref 3.5–5.2)
Sodium: 139 mmol/L (ref 134–144)
TOTAL PROTEIN: 7.9 g/dL (ref 6.0–8.5)

## 2017-06-13 LAB — LIPID PANEL
CHOLESTEROL TOTAL: 189 mg/dL (ref 100–199)
Chol/HDL Ratio: 4.6 ratio (ref 0.0–5.0)
HDL: 41 mg/dL (ref >39)
LDL Calculated: 108 mg/dL — ABNORMAL HIGH (ref 0–99)
Triglycerides: 198 mg/dL — ABNORMAL HIGH (ref 0–149)
VLDL CHOLESTEROL CAL: 40 mg/dL (ref 5–40)

## 2017-06-13 LAB — TSH: TSH: 1.18 u[IU]/mL (ref 0.450–4.500)

## 2017-06-13 LAB — URINALYSIS, DIPSTICK ONLY
BILIRUBIN UA: NEGATIVE
Glucose, UA: NEGATIVE
KETONES UA: NEGATIVE
NITRITE UA: NEGATIVE
PROTEIN UA: NEGATIVE
RBC UA: NEGATIVE
SPEC GRAV UA: 1.02 (ref 1.005–1.030)
Urobilinogen, Ur: 0.2 mg/dL (ref 0.2–1.0)
pH, UA: 5.5 (ref 5.0–7.5)

## 2017-06-13 LAB — HEMOGLOBIN A1C
Est. average glucose Bld gHb Est-mCnc: 120 mg/dL
HEMOGLOBIN A1C: 5.8 % — AB (ref 4.8–5.6)

## 2017-06-30 ENCOUNTER — Encounter: Payer: Self-pay | Admitting: Physician Assistant

## 2017-06-30 NOTE — Assessment & Plan Note (Signed)
Not quite back to baseline, but significantly improved. Continue working toward complete independence.

## 2017-06-30 NOTE — Assessment & Plan Note (Signed)
Stable

## 2017-06-30 NOTE — Assessment & Plan Note (Signed)
COntrolled. Continue current treatment. 

## 2017-06-30 NOTE — Assessment & Plan Note (Signed)
Has been controlled. Await labs. Adjust regimen as indicated by results.

## 2017-06-30 NOTE — Assessment & Plan Note (Signed)
Continue to maintain good DM control, follow-up per eye specialist.
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
## 2017-06-30 NOTE — Assessment & Plan Note (Signed)
Await labs. Adjust regimen as indicated by results.  

## 2017-07-04 ENCOUNTER — Encounter: Payer: Self-pay | Admitting: Physician Assistant

## 2017-11-18 ENCOUNTER — Encounter: Payer: Self-pay | Admitting: Physician Assistant

## 2017-11-20 IMAGING — DX DG FEMUR 2+V*R*
4 series · 4 of 4 positions shown · non-contrast
Comparison: Right femur series dated November 29, 2016

CLINICAL DATA: Status post fall yesterday with persistent right leg
pain. The patient underwent ORIF for a proximal femoral fracture
with telescoping screw and intramedullary rod placement in November 2016.

EXAM:
RIGHT FEMUR 2 VIEWS

[femur ap (1 of 2)]
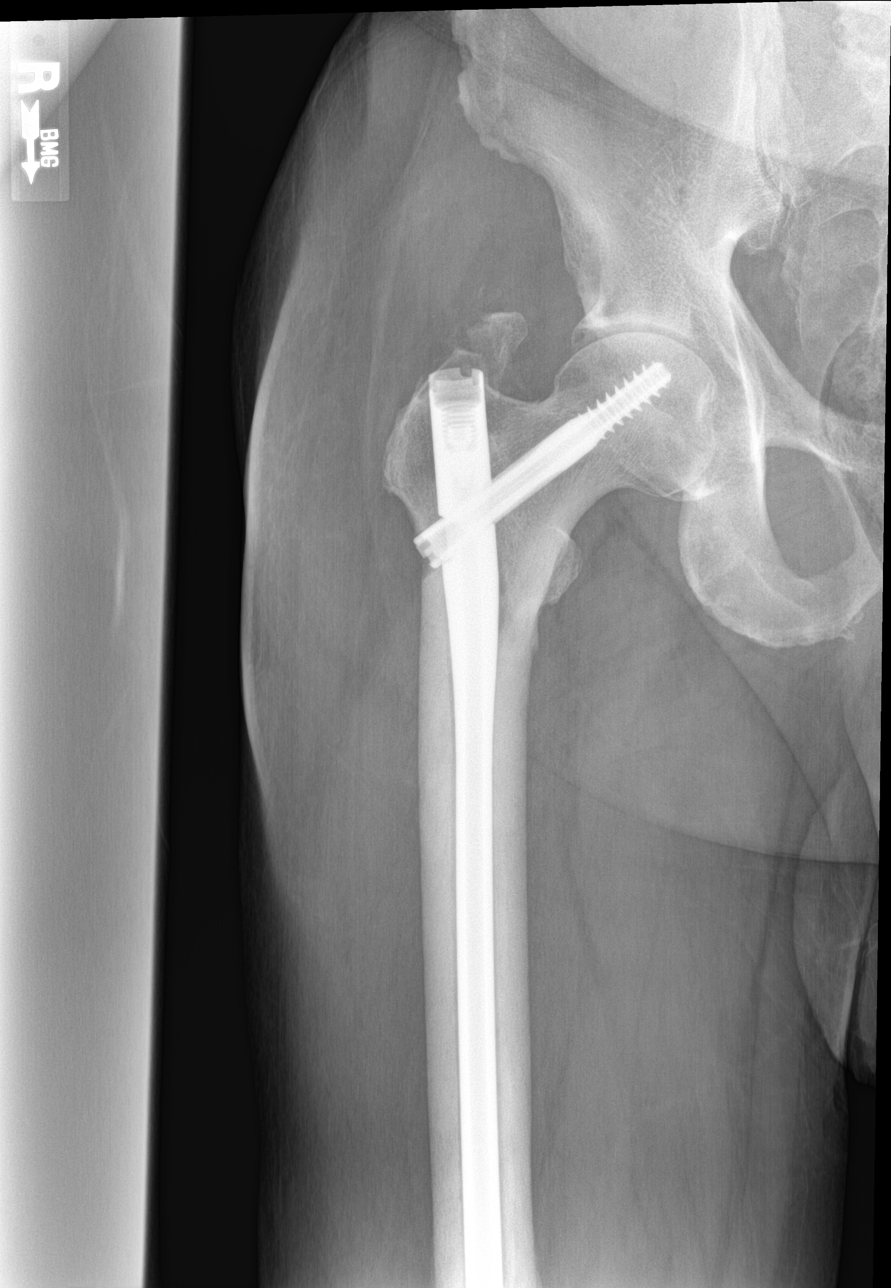

[femur ap (2 of 2)]
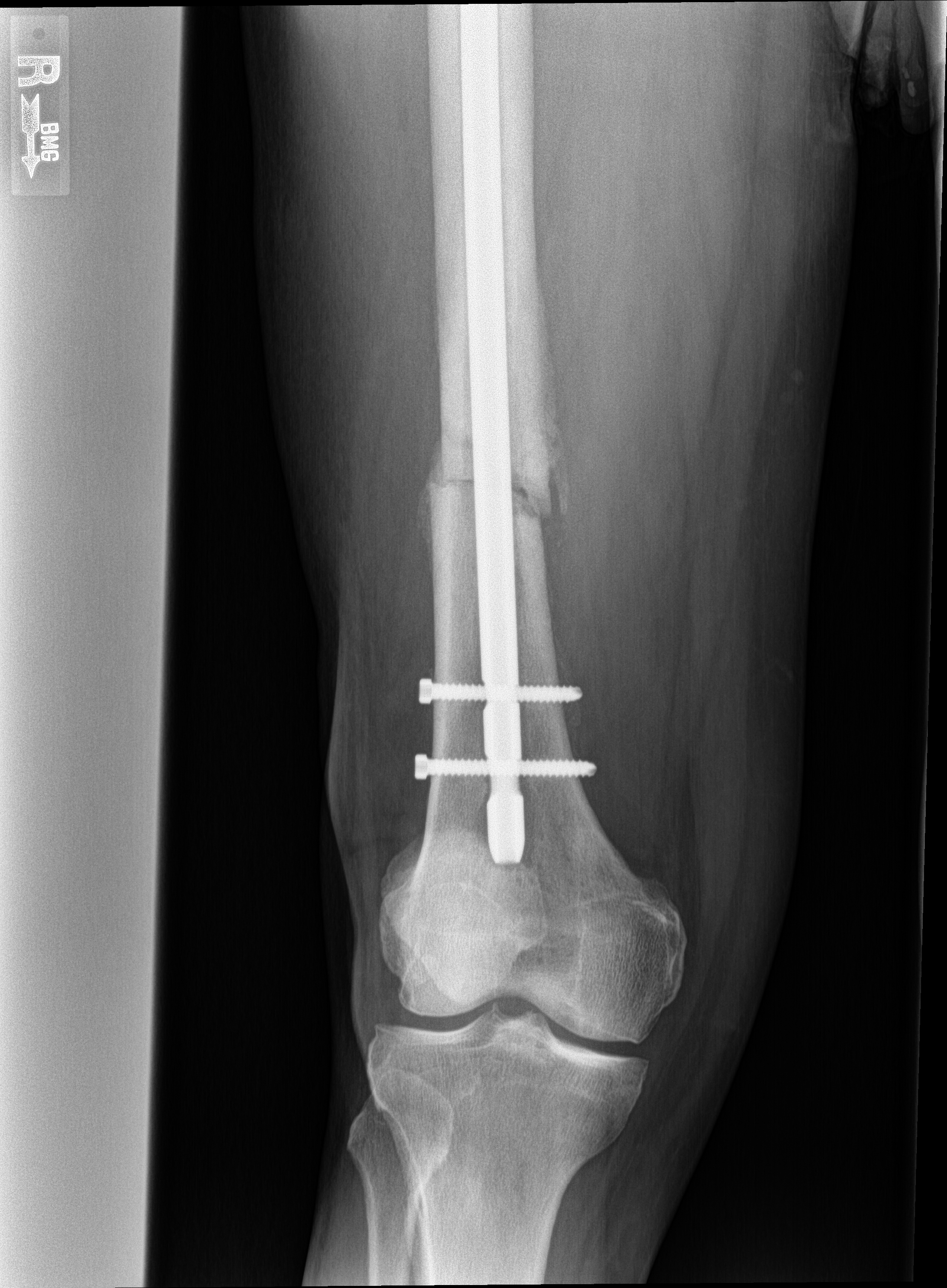

[femur lat (1 of 2)]
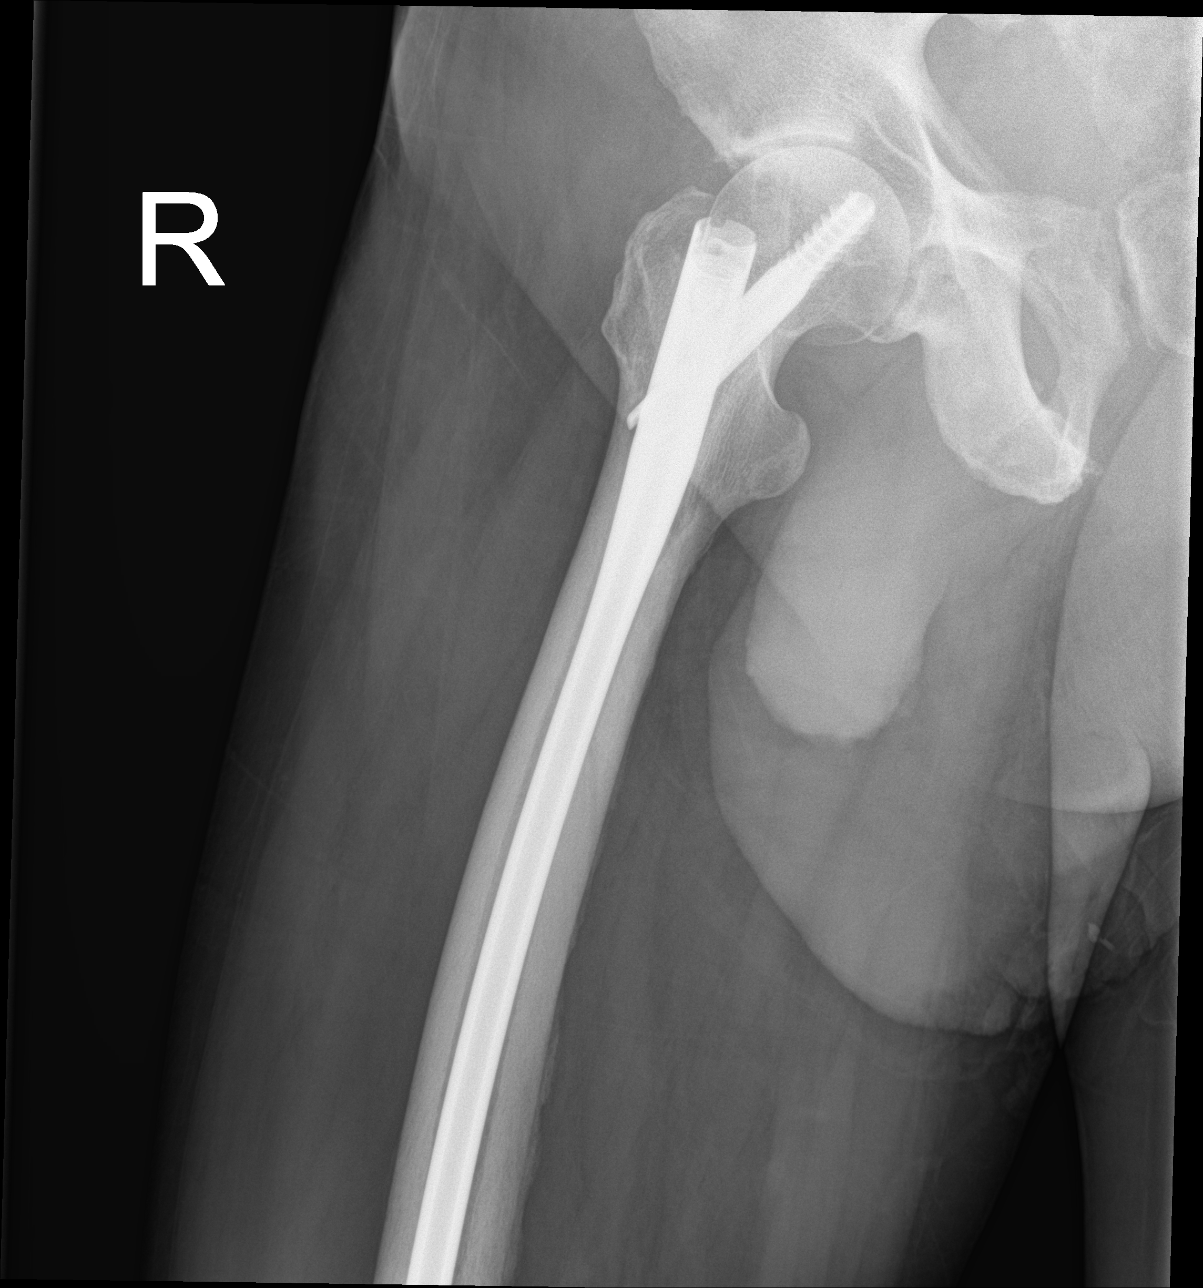

[femur lat (2 of 2)]
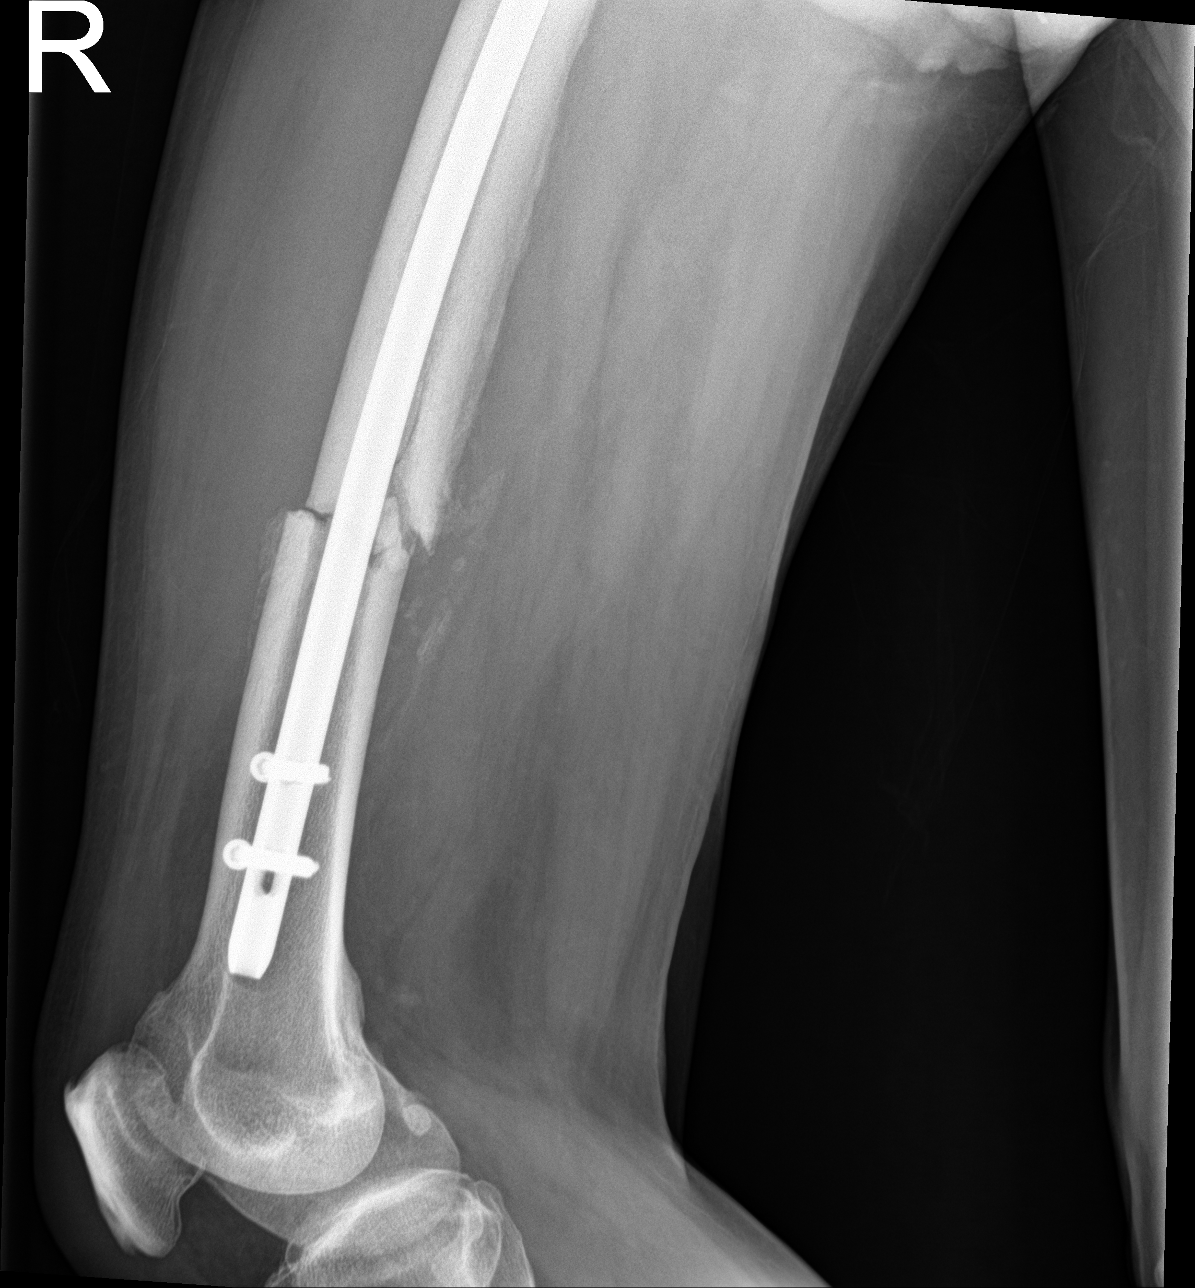

[4 of 4 positions shown; findings below may reference images not displayed]

FINDINGS: The patient recently sustained a fracture through the distal third
of the right femur. The fracture line remains visible. There is some
periosteal reaction which is not bridging. There appears to be
slightly more anterior and lateral displacement of the distal
fracture fragment with respect to the proximal portion of the femur.
No angulation is observed. The rod, cortical screws, and telescoping
screw appear normal. There is avulsion of the greater trochanteric
tip which appears to be chronic.
IMPRESSION: There is probable reinjury of the previously fractured distal
femoral shaft. Slightly more displacement is present. There is some
periosteal reaction. There is no angulation. The intramedullary rod
and accompanying screws appear intact and appropriately positioned.

## 2017-11-20 IMAGING — DX DG HIP (WITH OR WITHOUT PELVIS) 2-3V*R*
3 series · 3 of 3 positions shown · non-contrast
Comparison: Right femur series of November 29, 2016.

CLINICAL DATA: Right hip pain following a fall. Recent ORIF for a
distal femoral fracture.

EXAM:
DG HIP (WITH OR WITHOUT PELVIS) 2-3V RIGHT

[pelvis ap]
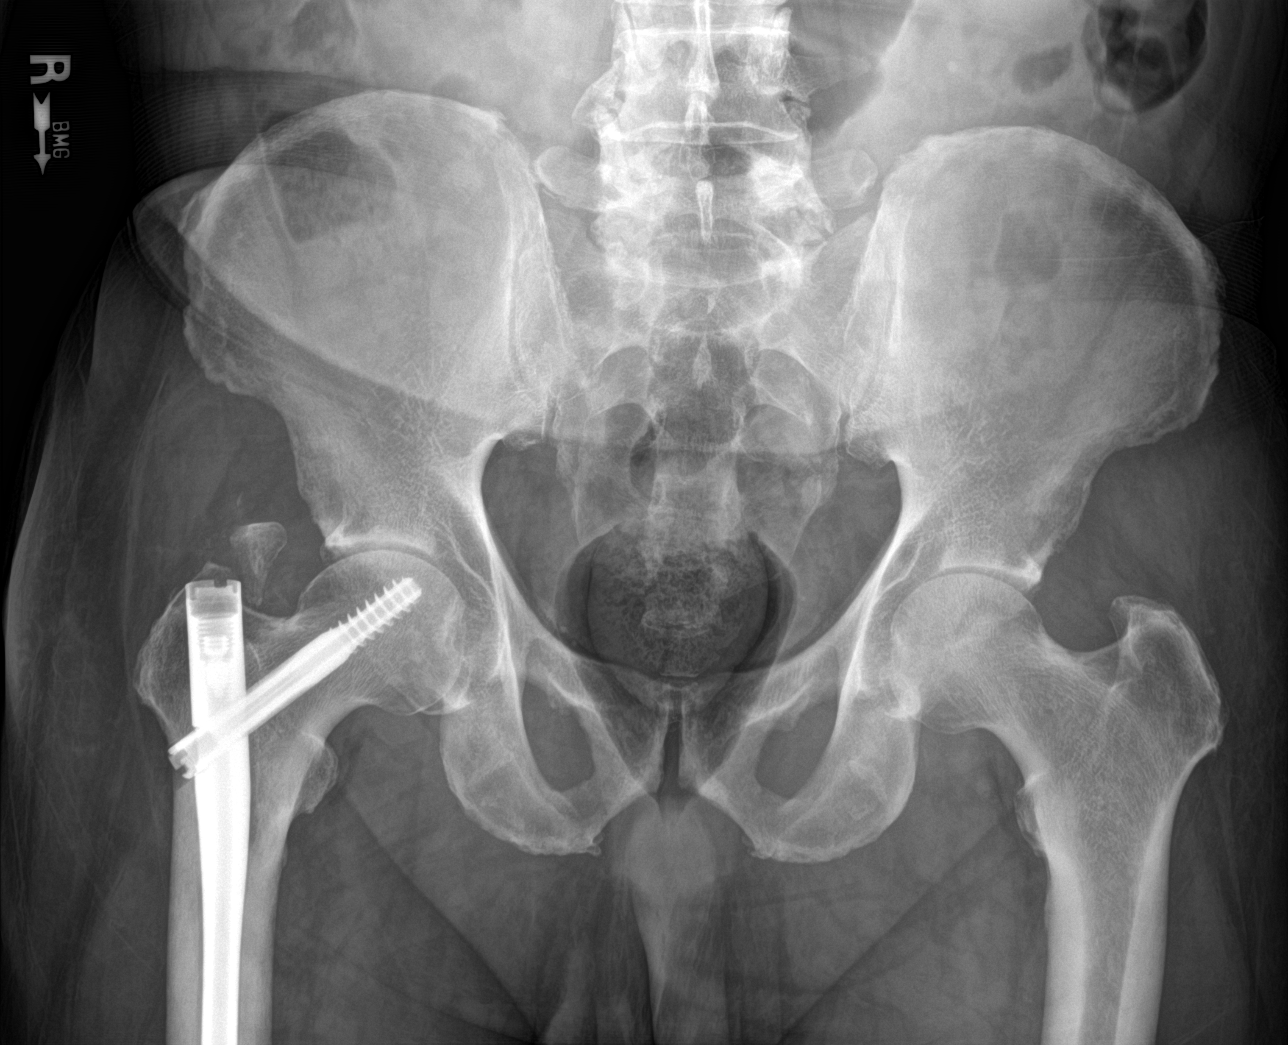

[hip ap]
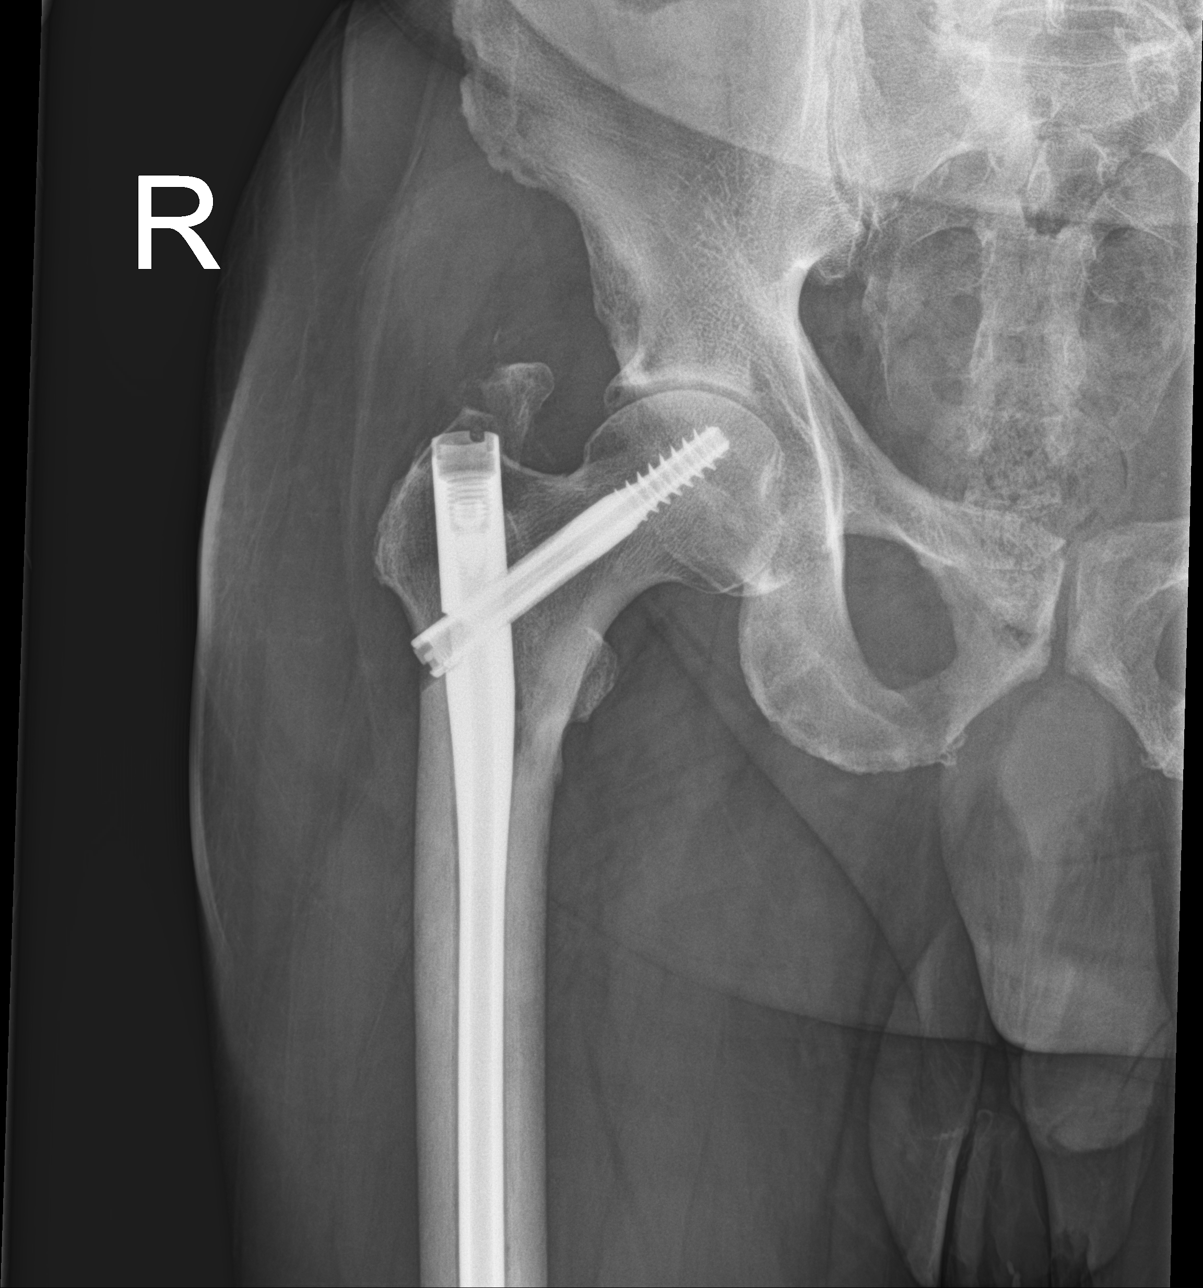

[hip lat]
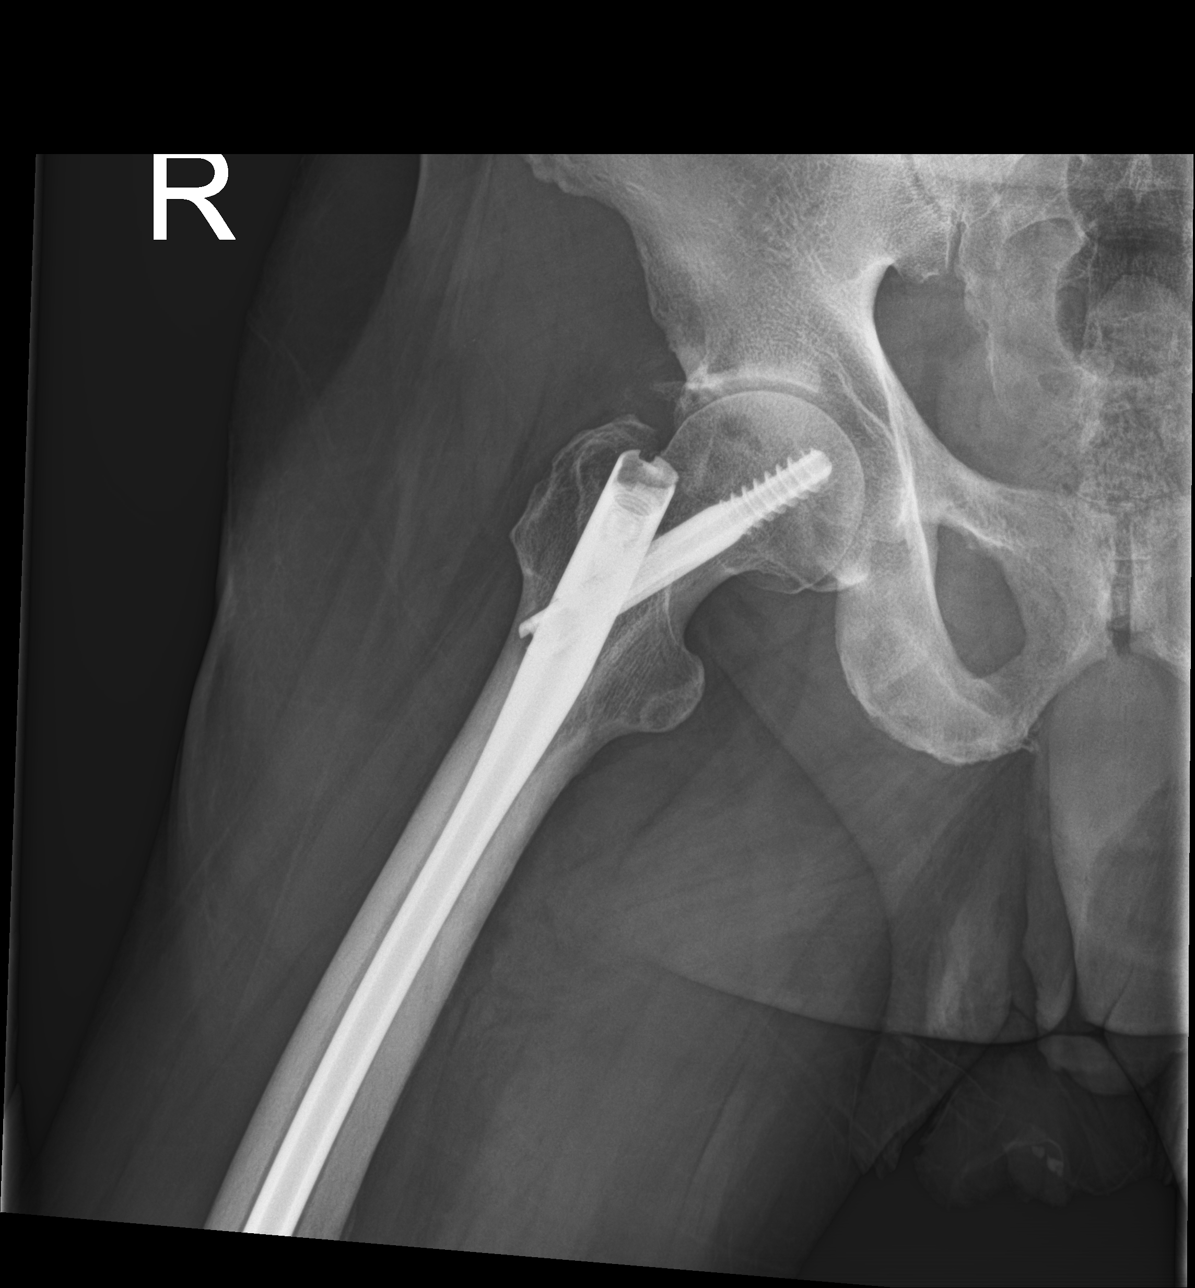

[3 of 3 positions shown; findings below may reference images not displayed]

FINDINGS: There is old avulsion of the tip of the greater trochanter on the
right. The telescoping screw and intramedullary rod appear normally
positioned. The hip joint exhibits mild symmetric narrowing. The
observed portions of the bony pelvis are unremarkable.
IMPRESSION: No acute abnormality of the right hip is observed. There is chronic
mineralization irregularity of the sub trochanteric region. Is there
history of previous sub trochanteric fracture?

## 2018-06-09 ENCOUNTER — Encounter: Payer: Self-pay | Admitting: Family Medicine

## 2018-06-09 LAB — HM DIABETES EYE EXAM

## 2019-05-26 LAB — HM DIABETES EYE EXAM

## 2019-06-01 ENCOUNTER — Other Ambulatory Visit: Payer: Self-pay | Admitting: Orthopedic Surgery

## 2019-06-01 DIAGNOSIS — M898X5 Other specified disorders of bone, thigh: Secondary | ICD-10-CM

## 2019-06-02 ENCOUNTER — Other Ambulatory Visit: Payer: Self-pay
